# Patient Record
Sex: Female | Born: 1935 | Race: White | Hispanic: No
Health system: Southern US, Community
[De-identification: ages and names within clinical notes are randomized; demographics above are authoritative.]

## PROBLEM LIST (undated history)

## (undated) DIAGNOSIS — F419 Anxiety disorder, unspecified: Secondary | ICD-10-CM

## (undated) DIAGNOSIS — I2510 Atherosclerotic heart disease of native coronary artery without angina pectoris: Secondary | ICD-10-CM

## (undated) DIAGNOSIS — E119 Type 2 diabetes mellitus without complications: Secondary | ICD-10-CM

## (undated) DIAGNOSIS — D649 Anemia, unspecified: Secondary | ICD-10-CM

## (undated) DIAGNOSIS — K219 Gastro-esophageal reflux disease without esophagitis: Secondary | ICD-10-CM

## (undated) DIAGNOSIS — I639 Cerebral infarction, unspecified: Secondary | ICD-10-CM

## (undated) DIAGNOSIS — M1990 Unspecified osteoarthritis, unspecified site: Secondary | ICD-10-CM

## (undated) HISTORY — DX: Cerebral infarction, unspecified: I63.9

## (undated) HISTORY — DX: Type 2 diabetes mellitus without complications: E11.9

## (undated) HISTORY — DX: Atherosclerotic heart disease of native coronary artery without angina pectoris: I25.10

## (undated) HISTORY — DX: Unspecified osteoarthritis, unspecified site: M19.90

## (undated) HISTORY — DX: Anemia, unspecified: D64.9

## (undated) HISTORY — DX: Gastro-esophageal reflux disease without esophagitis: K21.9

## (undated) HISTORY — DX: Anxiety disorder, unspecified: F41.9

---
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
## 1966-10-14 HISTORY — PX: CHOLECYSTECTOMY: SHX55

## 1969-01-15 DIAGNOSIS — I10 Essential (primary) hypertension: Secondary | ICD-10-CM | POA: Insufficient documentation

## 1972-10-14 HISTORY — PX: PATELLA FRACTURE SURGERY: SHX735

## 1990-10-14 HISTORY — PX: ESOPHAGEAL DILATION: SHX303

## 1994-10-14 HISTORY — PX: CATARACT EXTRACTION: SUR2

## 2004-02-20 ENCOUNTER — Other Ambulatory Visit: Payer: Self-pay

## 2004-06-26 ENCOUNTER — Other Ambulatory Visit: Payer: Self-pay

## 2004-06-29 ENCOUNTER — Other Ambulatory Visit: Payer: Self-pay

## 2004-08-02 ENCOUNTER — Other Ambulatory Visit: Payer: Self-pay

## 2004-08-02 ENCOUNTER — Inpatient Hospital Stay: Payer: Self-pay | Admitting: Internal Medicine

## 2005-03-05 ENCOUNTER — Ambulatory Visit: Payer: Self-pay | Admitting: Family Medicine

## 2006-02-19 ENCOUNTER — Encounter (INDEPENDENT_AMBULATORY_CARE_PROVIDER_SITE_OTHER): Payer: Self-pay | Admitting: *Deleted

## 2006-02-19 LAB — CONVERTED CEMR LAB: Hgb A1c MFr Bld: 6.4 %

## 2006-03-11 ENCOUNTER — Ambulatory Visit: Payer: Self-pay | Admitting: Family Medicine

## 2006-03-18 ENCOUNTER — Ambulatory Visit: Payer: Self-pay | Admitting: Family Medicine

## 2006-04-02 ENCOUNTER — Ambulatory Visit: Payer: Self-pay | Admitting: Internal Medicine

## 2006-04-06 ENCOUNTER — Other Ambulatory Visit: Payer: Self-pay

## 2006-04-06 ENCOUNTER — Inpatient Hospital Stay: Payer: Self-pay | Admitting: Specialist

## 2006-04-11 ENCOUNTER — Encounter: Payer: Self-pay | Admitting: Internal Medicine

## 2006-04-13 ENCOUNTER — Encounter: Payer: Self-pay | Admitting: Internal Medicine

## 2006-05-14 HISTORY — PX: ESOPHAGOGASTRODUODENOSCOPY: SHX1529

## 2006-05-16 ENCOUNTER — Ambulatory Visit: Payer: Self-pay | Admitting: Gastroenterology

## 2006-05-21 ENCOUNTER — Encounter (INDEPENDENT_AMBULATORY_CARE_PROVIDER_SITE_OTHER): Payer: Self-pay | Admitting: *Deleted

## 2006-05-21 ENCOUNTER — Encounter: Payer: Self-pay | Admitting: Internal Medicine

## 2006-05-21 LAB — CONVERTED CEMR LAB
RBC count: 3.6 10*6/uL
WBC, blood: 8.2 10*3/uL

## 2006-05-22 ENCOUNTER — Ambulatory Visit: Payer: Self-pay | Admitting: Gastroenterology

## 2006-05-30 ENCOUNTER — Ambulatory Visit: Payer: Self-pay | Admitting: Gastroenterology

## 2006-06-10 ENCOUNTER — Ambulatory Visit: Payer: Self-pay | Admitting: Internal Medicine

## 2006-07-11 ENCOUNTER — Ambulatory Visit: Payer: Self-pay | Admitting: Family Medicine

## 2006-07-25 ENCOUNTER — Ambulatory Visit: Payer: Self-pay | Admitting: *Deleted

## 2006-08-13 ENCOUNTER — Encounter (INDEPENDENT_AMBULATORY_CARE_PROVIDER_SITE_OTHER): Payer: Self-pay | Admitting: *Deleted

## 2006-10-30 ENCOUNTER — Ambulatory Visit: Payer: Self-pay | Admitting: *Deleted

## 2006-11-26 ENCOUNTER — Encounter: Payer: Self-pay | Admitting: Internal Medicine

## 2007-01-16 ENCOUNTER — Ambulatory Visit: Payer: Self-pay | Admitting: *Deleted

## 2007-01-16 DIAGNOSIS — M722 Plantar fascial fibromatosis: Secondary | ICD-10-CM

## 2007-01-16 DIAGNOSIS — I7389 Other specified peripheral vascular diseases: Secondary | ICD-10-CM

## 2007-01-20 ENCOUNTER — Encounter: Payer: Self-pay | Admitting: *Deleted

## 2007-01-25 DIAGNOSIS — K219 Gastro-esophageal reflux disease without esophagitis: Secondary | ICD-10-CM | POA: Insufficient documentation

## 2007-01-25 DIAGNOSIS — E119 Type 2 diabetes mellitus without complications: Secondary | ICD-10-CM

## 2007-01-25 DIAGNOSIS — F411 Generalized anxiety disorder: Secondary | ICD-10-CM | POA: Insufficient documentation

## 2007-01-25 DIAGNOSIS — D649 Anemia, unspecified: Secondary | ICD-10-CM

## 2007-01-25 DIAGNOSIS — Z8679 Personal history of other diseases of the circulatory system: Secondary | ICD-10-CM | POA: Insufficient documentation

## 2007-01-28 ENCOUNTER — Encounter (INDEPENDENT_AMBULATORY_CARE_PROVIDER_SITE_OTHER): Payer: Self-pay | Admitting: *Deleted

## 2007-01-31 DIAGNOSIS — F329 Major depressive disorder, single episode, unspecified: Secondary | ICD-10-CM

## 2007-01-31 DIAGNOSIS — F3289 Other specified depressive episodes: Secondary | ICD-10-CM | POA: Insufficient documentation

## 2007-02-04 IMAGING — CR DG HIP COMPLETE 2+V*L*
1 series · 2 of 2 positions shown · non-contrast
Comparison: none

REASON FOR EXAM: fall
COMMENTS:  LMP: Post-Menopausal

[Series 1: view not recorded · 0.17mm/px · 2 of 2 slices shown]
[im 1/2]
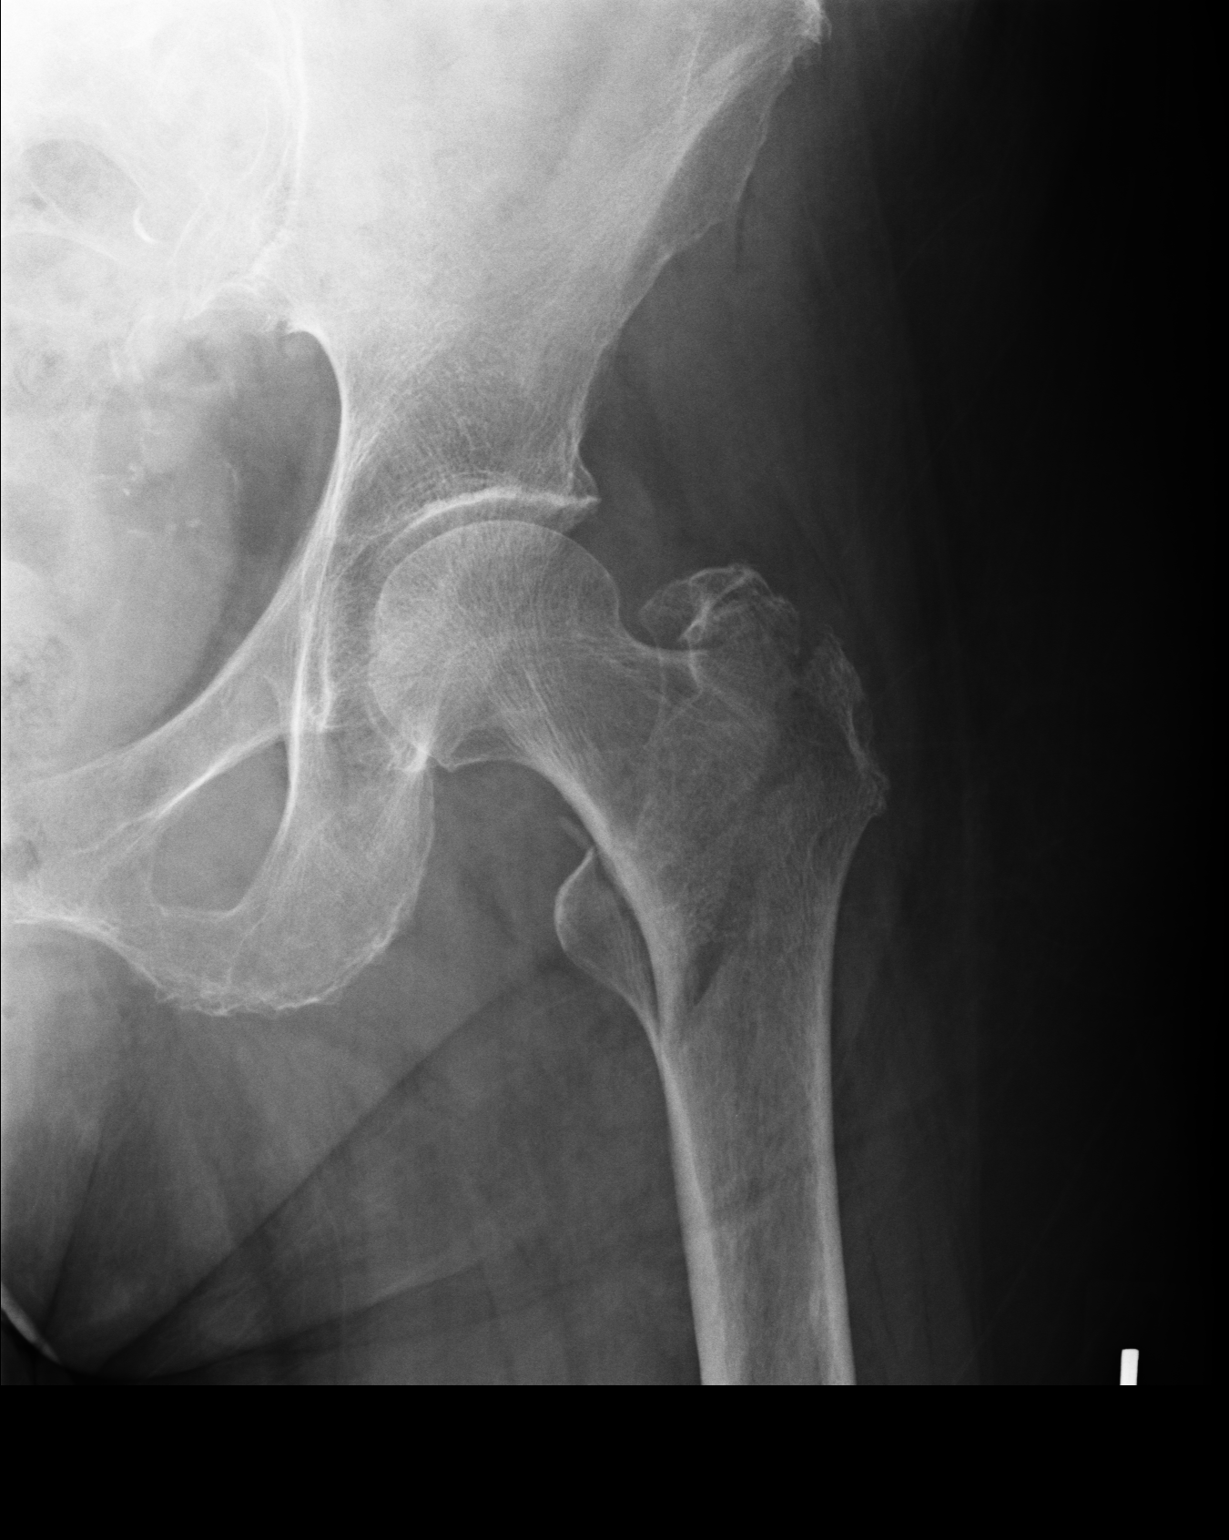
[im 2/2]
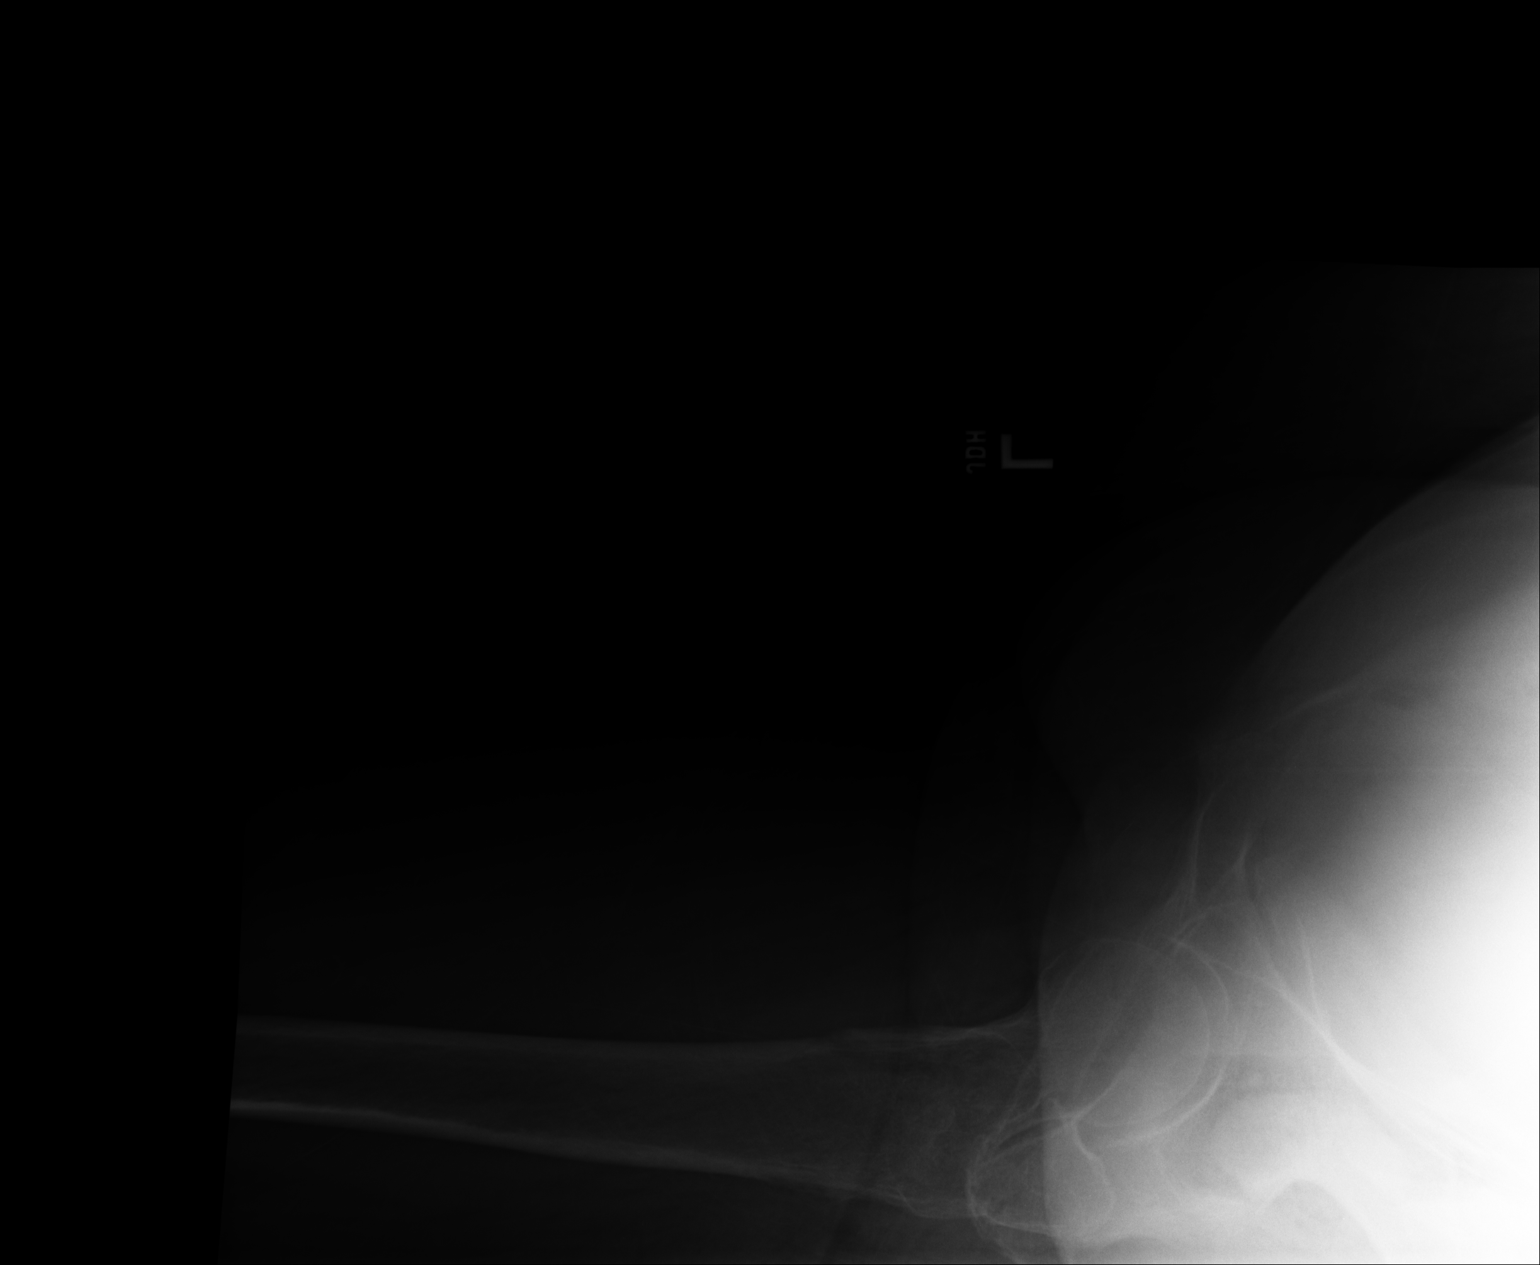

[2 of 2 positions shown; findings below may reference images not displayed]

PROCEDURE:     DXR - DXR HIP LEFT COMPLETE  - April 06, 2006  [DATE]

RESULT:          The patient has sustained an intertrochanteric fracture of
the LEFT  hip.  There is likely avulsion of the lesser trochanter and a
portion of the greater trochanter.  The femoral head and neck themselves are
grossly intact, and the bony acetabulum is intact.
IMPRESSION: The patient has sustained an intertrochanteric fracture
of the LEFT hip.

## 2007-02-05 ENCOUNTER — Ambulatory Visit: Payer: Self-pay | Admitting: *Deleted

## 2007-02-05 DIAGNOSIS — R350 Frequency of micturition: Secondary | ICD-10-CM | POA: Insufficient documentation

## 2007-02-06 ENCOUNTER — Encounter: Payer: Self-pay | Admitting: *Deleted

## 2007-03-19 ENCOUNTER — Ambulatory Visit: Payer: Self-pay | Admitting: *Deleted

## 2007-04-22 ENCOUNTER — Encounter (INDEPENDENT_AMBULATORY_CARE_PROVIDER_SITE_OTHER): Payer: Self-pay | Admitting: *Deleted

## 2007-05-08 ENCOUNTER — Ambulatory Visit: Payer: Self-pay | Admitting: *Deleted

## 2007-05-12 ENCOUNTER — Telehealth (INDEPENDENT_AMBULATORY_CARE_PROVIDER_SITE_OTHER): Payer: Self-pay | Admitting: *Deleted

## 2007-05-27 ENCOUNTER — Encounter (INDEPENDENT_AMBULATORY_CARE_PROVIDER_SITE_OTHER): Payer: Self-pay | Admitting: *Deleted

## 2007-05-29 ENCOUNTER — Encounter (INDEPENDENT_AMBULATORY_CARE_PROVIDER_SITE_OTHER): Payer: Self-pay | Admitting: *Deleted

## 2007-06-25 ENCOUNTER — Encounter (INDEPENDENT_AMBULATORY_CARE_PROVIDER_SITE_OTHER): Payer: Self-pay | Admitting: *Deleted

## 2007-07-13 ENCOUNTER — Telehealth (INDEPENDENT_AMBULATORY_CARE_PROVIDER_SITE_OTHER): Payer: Self-pay | Admitting: *Deleted

## 2007-07-13 ENCOUNTER — Ambulatory Visit: Payer: Self-pay | Admitting: *Deleted

## 2007-07-22 ENCOUNTER — Telehealth: Payer: Self-pay | Admitting: Family Medicine

## 2007-07-28 ENCOUNTER — Encounter: Payer: Self-pay | Admitting: Family Medicine

## 2007-08-04 ENCOUNTER — Encounter: Payer: Self-pay | Admitting: Internal Medicine

## 2007-08-24 ENCOUNTER — Encounter: Payer: Self-pay | Admitting: *Deleted

## 2007-08-24 ENCOUNTER — Ambulatory Visit: Payer: Self-pay | Admitting: Otolaryngology

## 2007-08-24 ENCOUNTER — Ambulatory Visit: Payer: Self-pay | Admitting: *Deleted

## 2007-08-24 ENCOUNTER — Other Ambulatory Visit: Payer: Self-pay

## 2007-08-24 ENCOUNTER — Encounter (INDEPENDENT_AMBULATORY_CARE_PROVIDER_SITE_OTHER): Payer: Self-pay | Admitting: *Deleted

## 2007-08-26 ENCOUNTER — Ambulatory Visit: Payer: Self-pay | Admitting: Otolaryngology

## 2007-09-21 ENCOUNTER — Ambulatory Visit: Payer: Self-pay | Admitting: Cardiology

## 2007-12-10 ENCOUNTER — Ambulatory Visit: Payer: Self-pay | Admitting: *Deleted

## 2007-12-10 ENCOUNTER — Encounter: Payer: Self-pay | Admitting: *Deleted

## 2007-12-10 ENCOUNTER — Encounter (INDEPENDENT_AMBULATORY_CARE_PROVIDER_SITE_OTHER): Payer: Self-pay | Admitting: *Deleted

## 2007-12-11 ENCOUNTER — Ambulatory Visit: Payer: Self-pay | Admitting: Family Medicine

## 2007-12-11 DIAGNOSIS — E785 Hyperlipidemia, unspecified: Secondary | ICD-10-CM

## 2007-12-15 ENCOUNTER — Telehealth (INDEPENDENT_AMBULATORY_CARE_PROVIDER_SITE_OTHER): Payer: Self-pay | Admitting: *Deleted

## 2007-12-15 LAB — CONVERTED CEMR LAB
ALT: 13 units/L (ref 0–35)
AST: 18 units/L (ref 0–37)
Albumin: 3.6 g/dL (ref 3.5–5.2)
Alkaline Phosphatase: 44 units/L (ref 39–117)
BUN: 18 mg/dL (ref 6–23)
Basophils Absolute: 0 10*3/uL (ref 0.0–0.1)
Basophils Relative: 0.6 % (ref 0.0–1.0)
Bilirubin, Direct: 0.1 mg/dL (ref 0.0–0.3)
CO2: 27 meq/L (ref 19–32)
CRP, High Sensitivity: 1 (ref 0.00–5.00)
Calcium: 9.5 mg/dL (ref 8.4–10.5)
Chloride: 107 meq/L (ref 96–112)
Cholesterol: 168 mg/dL (ref 0–200)
Creatinine, Ser: 1.4 mg/dL — ABNORMAL HIGH (ref 0.4–1.2)
Eosinophils Absolute: 0.3 10*3/uL (ref 0.0–0.6)
Eosinophils Relative: 3.4 % (ref 0.0–5.0)
Free T4: 0.8 ng/dL (ref 0.6–1.6)
GFR calc Af Amer: 46 mL/min
GFR calc non Af Amer: 38 mL/min
Glucose, Bld: 137 mg/dL — ABNORMAL HIGH (ref 70–99)
HCT: 35.2 % — ABNORMAL LOW (ref 36.0–46.0)
HDL: 65.8 mg/dL (ref >39.0)
Hemoglobin: 11.2 g/dL — ABNORMAL LOW (ref 12.0–15.0)
Hgb A1c MFr Bld: 6.6 % — ABNORMAL HIGH (ref 4.6–6.0)
LDL Cholesterol: 77 mg/dL (ref 0–99)
Lymphocytes Relative: 22.5 % (ref 12.0–46.0)
MCHC: 31.8 g/dL (ref 30.0–36.0)
MCV: 90.1 fL (ref 78.0–100.0)
Monocytes Absolute: 0.6 10*3/uL (ref 0.2–0.7)
Monocytes Relative: 7.9 % (ref 3.0–11.0)
Neutro Abs: 5.5 10*3/uL (ref 1.4–7.7)
Neutrophils Relative %: 65.6 % (ref 43.0–77.0)
Platelets: 206 10*3/uL (ref 150–400)
Potassium: 4.2 meq/L (ref 3.5–5.1)
RBC: 3.91 M/uL (ref 3.87–5.11)
RDW: 14.4 % (ref 11.5–14.6)
Sed Rate: 15 mm/hr (ref 0–25)
Sodium: 143 meq/L (ref 135–145)
TSH: 2.51 microintl units/mL (ref 0.35–5.50)
Total Bilirubin: 0.7 mg/dL (ref 0.3–1.2)
Total CHOL/HDL Ratio: 2.6
Total Protein: 6.5 g/dL (ref 6.0–8.3)
Triglycerides: 126 mg/dL (ref 0–149)
VLDL: 25 mg/dL (ref 0–40)
WBC: 8.2 10*3/uL (ref 4.5–10.5)

## 2007-12-24 ENCOUNTER — Telehealth (INDEPENDENT_AMBULATORY_CARE_PROVIDER_SITE_OTHER): Payer: Self-pay | Admitting: *Deleted

## 2008-01-11 ENCOUNTER — Encounter (INDEPENDENT_AMBULATORY_CARE_PROVIDER_SITE_OTHER): Payer: Self-pay | Admitting: *Deleted

## 2008-02-09 ENCOUNTER — Ambulatory Visit: Payer: Self-pay | Admitting: *Deleted

## 2008-02-18 ENCOUNTER — Encounter (INDEPENDENT_AMBULATORY_CARE_PROVIDER_SITE_OTHER): Payer: Self-pay | Admitting: *Deleted

## 2008-03-04 ENCOUNTER — Encounter (INDEPENDENT_AMBULATORY_CARE_PROVIDER_SITE_OTHER): Payer: Self-pay | Admitting: *Deleted

## 2008-03-04 ENCOUNTER — Ambulatory Visit: Payer: Self-pay | Admitting: *Deleted

## 2008-03-04 ENCOUNTER — Encounter: Payer: Self-pay | Admitting: *Deleted

## 2008-03-19 DIAGNOSIS — G4700 Insomnia, unspecified: Secondary | ICD-10-CM | POA: Insufficient documentation

## 2008-03-21 ENCOUNTER — Encounter (INDEPENDENT_AMBULATORY_CARE_PROVIDER_SITE_OTHER): Payer: Self-pay | Admitting: *Deleted

## 2008-04-05 ENCOUNTER — Encounter: Payer: Self-pay | Admitting: Internal Medicine

## 2008-04-14 ENCOUNTER — Encounter: Payer: Self-pay | Admitting: Internal Medicine

## 2008-06-01 ENCOUNTER — Encounter: Payer: Self-pay | Admitting: Internal Medicine

## 2008-06-17 ENCOUNTER — Telehealth: Payer: Self-pay | Admitting: Internal Medicine

## 2008-06-17 ENCOUNTER — Encounter: Payer: Self-pay | Admitting: Internal Medicine

## 2008-06-21 ENCOUNTER — Ambulatory Visit: Payer: Self-pay | Admitting: Internal Medicine

## 2008-06-22 ENCOUNTER — Encounter: Payer: Self-pay | Admitting: Internal Medicine

## 2008-06-22 LAB — CONVERTED CEMR LAB: RBC / HPF: NONE SEEN (ref <3)

## 2008-06-29 ENCOUNTER — Encounter: Payer: Self-pay | Admitting: Internal Medicine

## 2008-06-29 ENCOUNTER — Ambulatory Visit: Payer: Self-pay | Admitting: Internal Medicine

## 2008-06-29 DIAGNOSIS — I2510 Atherosclerotic heart disease of native coronary artery without angina pectoris: Secondary | ICD-10-CM | POA: Insufficient documentation

## 2008-06-30 ENCOUNTER — Encounter: Payer: Self-pay | Admitting: Internal Medicine

## 2008-07-28 ENCOUNTER — Telehealth: Payer: Self-pay | Admitting: Internal Medicine

## 2008-08-03 ENCOUNTER — Encounter: Payer: Self-pay | Admitting: Family Medicine

## 2008-08-08 ENCOUNTER — Ambulatory Visit: Payer: Medicare Other | Admitting: Gastroenterology

## 2008-08-14 HISTORY — PX: OTHER SURGICAL HISTORY: SHX169

## 2008-08-17 ENCOUNTER — Encounter: Payer: Self-pay | Admitting: Internal Medicine

## 2008-08-23 ENCOUNTER — Encounter: Payer: Self-pay | Admitting: Orthopedic Surgery

## 2008-08-23 ENCOUNTER — Telehealth: Payer: Self-pay | Admitting: Internal Medicine

## 2008-08-24 ENCOUNTER — Encounter: Payer: Self-pay | Admitting: Internal Medicine

## 2008-08-25 ENCOUNTER — Telehealth: Payer: Self-pay | Admitting: Internal Medicine

## 2008-08-25 ENCOUNTER — Encounter: Payer: Self-pay | Admitting: Internal Medicine

## 2008-08-25 LAB — CONVERTED CEMR LAB
Bilirubin Urine: NEGATIVE
Hemoglobin, Urine: NEGATIVE
Ketones, ur: NEGATIVE mg/dL
Nitrite: POSITIVE — AB
Protein, ur: NEGATIVE mg/dL
Specific Gravity, Urine: 1.027 (ref 1.005–1.03)
Urine Glucose: >1000 mg/dL — AB
Urobilinogen, UA: 0.2 (ref 0.0–1.0)
pH: 5.5 (ref 5.0–8.0)

## 2008-09-06 ENCOUNTER — Encounter: Payer: Self-pay | Admitting: Orthopedic Surgery

## 2008-09-19 ENCOUNTER — Encounter: Payer: Self-pay | Admitting: Internal Medicine

## 2008-09-20 ENCOUNTER — Encounter: Payer: Self-pay | Admitting: Internal Medicine

## 2008-09-20 ENCOUNTER — Telehealth: Payer: Self-pay | Admitting: Internal Medicine

## 2008-09-20 LAB — CONVERTED CEMR LAB
Bilirubin Urine: NEGATIVE
Blood in Urine, dipstick: NEGATIVE
Glucose, Urine, Semiquant: >=1000
Nitrite: NEGATIVE
Specific Gravity, Urine: 1.01
Urobilinogen, UA: 0.2
WBC Urine, dipstick: NEGATIVE
pH: 5

## 2008-09-21 ENCOUNTER — Encounter: Payer: Self-pay | Admitting: Internal Medicine

## 2008-09-26 ENCOUNTER — Telehealth: Payer: Self-pay | Admitting: Orthopedic Surgery

## 2008-09-26 ENCOUNTER — Encounter: Payer: Self-pay | Admitting: Orthopedic Surgery

## 2008-09-28 ENCOUNTER — Telehealth: Payer: Self-pay | Admitting: Internal Medicine

## 2008-10-24 ENCOUNTER — Telehealth: Payer: Self-pay | Admitting: Internal Medicine

## 2008-10-26 ENCOUNTER — Telehealth: Payer: Self-pay | Admitting: Family Medicine

## 2008-10-27 ENCOUNTER — Encounter: Payer: Self-pay | Admitting: Orthopedic Surgery

## 2008-10-28 ENCOUNTER — Telehealth: Payer: Self-pay | Admitting: Internal Medicine

## 2008-10-31 ENCOUNTER — Ambulatory Visit: Payer: Self-pay | Admitting: Internal Medicine

## 2008-11-01 ENCOUNTER — Encounter: Payer: Self-pay | Admitting: Orthopedic Surgery

## 2008-11-02 ENCOUNTER — Emergency Department: Payer: Medicare Other

## 2008-11-02 ENCOUNTER — Encounter: Payer: Self-pay | Admitting: Orthopedic Surgery

## 2008-11-03 ENCOUNTER — Encounter: Payer: Self-pay | Admitting: Internal Medicine

## 2008-11-07 ENCOUNTER — Encounter: Payer: Self-pay | Admitting: Orthopedic Surgery

## 2008-11-07 ENCOUNTER — Inpatient Hospital Stay: Payer: Medicare Other | Admitting: Internal Medicine

## 2008-11-07 ENCOUNTER — Telehealth: Payer: Self-pay | Admitting: Family Medicine

## 2008-11-11 ENCOUNTER — Encounter: Payer: Medicare Other | Admitting: Internal Medicine

## 2008-11-15 ENCOUNTER — Encounter: Payer: Self-pay | Admitting: Internal Medicine

## 2008-11-16 ENCOUNTER — Encounter: Payer: Medicare Other | Admitting: Internal Medicine

## 2008-12-12 ENCOUNTER — Encounter: Payer: Medicare Other | Admitting: Internal Medicine

## 2008-12-19 ENCOUNTER — Telehealth: Payer: Self-pay | Admitting: Internal Medicine

## 2008-12-22 ENCOUNTER — Encounter: Payer: Self-pay | Admitting: Orthopedic Surgery

## 2009-01-04 ENCOUNTER — Telehealth: Payer: Self-pay | Admitting: Internal Medicine

## 2009-01-18 ENCOUNTER — Telehealth: Payer: Self-pay | Admitting: Internal Medicine

## 2009-01-27 ENCOUNTER — Telehealth: Payer: Self-pay | Admitting: Internal Medicine

## 2009-03-06 ENCOUNTER — Encounter: Payer: Self-pay | Admitting: Internal Medicine

## 2009-03-09 ENCOUNTER — Ambulatory Visit: Payer: Self-pay | Admitting: Internal Medicine

## 2009-03-09 ENCOUNTER — Encounter: Payer: Self-pay | Admitting: Internal Medicine

## 2009-04-10 ENCOUNTER — Encounter: Payer: Self-pay | Admitting: Family Medicine

## 2009-04-20 ENCOUNTER — Encounter: Payer: Self-pay | Admitting: Internal Medicine

## 2009-04-24 ENCOUNTER — Encounter: Payer: Self-pay | Admitting: Internal Medicine

## 2009-04-25 ENCOUNTER — Telehealth: Payer: Self-pay | Admitting: Internal Medicine

## 2009-05-01 ENCOUNTER — Telehealth: Payer: Self-pay | Admitting: Internal Medicine

## 2009-05-05 ENCOUNTER — Encounter: Payer: Self-pay | Admitting: Internal Medicine

## 2009-05-15 ENCOUNTER — Encounter: Payer: Self-pay | Admitting: Internal Medicine

## 2009-05-26 ENCOUNTER — Telehealth: Payer: Self-pay | Admitting: Internal Medicine

## 2009-05-26 ENCOUNTER — Encounter: Payer: Self-pay | Admitting: Internal Medicine

## 2009-05-29 ENCOUNTER — Encounter: Payer: Self-pay | Admitting: Internal Medicine

## 2009-05-29 ENCOUNTER — Telehealth: Payer: Self-pay | Admitting: Internal Medicine

## 2009-06-07 ENCOUNTER — Telehealth: Payer: Self-pay | Admitting: Internal Medicine

## 2009-06-08 ENCOUNTER — Encounter: Payer: Self-pay | Admitting: Internal Medicine

## 2009-06-22 ENCOUNTER — Encounter: Payer: Self-pay | Admitting: Internal Medicine

## 2009-06-26 ENCOUNTER — Encounter: Payer: Self-pay | Admitting: Internal Medicine

## 2009-06-30 ENCOUNTER — Encounter: Payer: Self-pay | Admitting: Internal Medicine

## 2009-06-30 ENCOUNTER — Telehealth: Payer: Self-pay | Admitting: Internal Medicine

## 2009-07-03 ENCOUNTER — Telehealth: Payer: Self-pay | Admitting: Internal Medicine

## 2009-07-04 ENCOUNTER — Telehealth: Payer: Self-pay | Admitting: Internal Medicine

## 2009-07-06 ENCOUNTER — Encounter: Payer: Self-pay | Admitting: Internal Medicine

## 2009-07-06 DIAGNOSIS — M1990 Unspecified osteoarthritis, unspecified site: Secondary | ICD-10-CM | POA: Insufficient documentation

## 2009-07-06 DIAGNOSIS — J309 Allergic rhinitis, unspecified: Secondary | ICD-10-CM | POA: Insufficient documentation

## 2009-07-06 DIAGNOSIS — R42 Dizziness and giddiness: Secondary | ICD-10-CM

## 2009-07-07 ENCOUNTER — Telehealth: Payer: Self-pay | Admitting: Internal Medicine

## 2009-07-07 ENCOUNTER — Encounter: Payer: Self-pay | Admitting: Internal Medicine

## 2009-07-12 ENCOUNTER — Encounter: Payer: Self-pay | Admitting: Internal Medicine

## 2009-07-12 ENCOUNTER — Telehealth: Payer: Self-pay | Admitting: Internal Medicine

## 2009-07-14 ENCOUNTER — Ambulatory Visit: Payer: Self-pay | Admitting: Internal Medicine

## 2009-07-17 ENCOUNTER — Encounter: Payer: Self-pay | Admitting: Internal Medicine

## 2009-08-23 ENCOUNTER — Telehealth: Payer: Self-pay | Admitting: Internal Medicine

## 2009-08-25 ENCOUNTER — Ambulatory Visit: Payer: Self-pay | Admitting: Internal Medicine

## 2009-08-28 LAB — CONVERTED CEMR LAB
ALT: 11 units/L (ref 0–35)
AST: 16 units/L (ref 0–37)
Albumin: 3.3 g/dL — ABNORMAL LOW (ref 3.5–5.2)
Alkaline Phosphatase: 69 units/L (ref 39–117)
BUN: 10 mg/dL (ref 6–23)
Basophils Absolute: 0 10*3/uL (ref 0.0–0.1)
Basophils Relative: 0.3 % (ref 0.0–3.0)
Bilirubin, Direct: 0 mg/dL (ref 0.0–0.3)
CO2: 26 meq/L (ref 19–32)
Calcium: 8.9 mg/dL (ref 8.4–10.5)
Chloride: 105 meq/L (ref 96–112)
Cholesterol: 215 mg/dL — ABNORMAL HIGH (ref 0–200)
Creatinine, Ser: 1.1 mg/dL (ref 0.4–1.2)
Direct LDL: 150.6 mg/dL
Eosinophils Absolute: 0.1 10*3/uL (ref 0.0–0.7)
Eosinophils Relative: 1.4 % (ref 0.0–5.0)
GFR calc non Af Amer: 49.84 mL/min (ref >60)
Glucose, Bld: 174 mg/dL — ABNORMAL HIGH (ref 70–99)
HCT: 29.8 % — ABNORMAL LOW (ref 36.0–46.0)
HDL: 41.9 mg/dL (ref >39.00)
Hemoglobin: 10 g/dL — ABNORMAL LOW (ref 12.0–15.0)
Hgb A1c MFr Bld: 7.5 % — ABNORMAL HIGH (ref 4.6–6.5)
Lymphocytes Relative: 21.5 % (ref 12.0–46.0)
Lymphs Abs: 2 10*3/uL (ref 0.7–4.0)
MCHC: 33.6 g/dL (ref 30.0–36.0)
MCV: 76.9 fL — ABNORMAL LOW (ref 78.0–100.0)
Monocytes Absolute: 0.7 10*3/uL (ref 0.1–1.0)
Monocytes Relative: 7.2 % (ref 3.0–12.0)
Neutro Abs: 6.6 10*3/uL (ref 1.4–7.7)
Neutrophils Relative %: 69.6 % (ref 43.0–77.0)
Phosphorus: 3.7 mg/dL (ref 2.3–4.6)
Platelets: 271 10*3/uL (ref 150.0–400.0)
Potassium: 4.6 meq/L (ref 3.5–5.1)
RBC: 3.87 M/uL (ref 3.87–5.11)
RDW: 16.7 % — ABNORMAL HIGH (ref 11.5–14.6)
Sodium: 140 meq/L (ref 135–145)
TSH: 1.39 microintl units/mL (ref 0.35–5.50)
Total Bilirubin: 0.6 mg/dL (ref 0.3–1.2)
Total CHOL/HDL Ratio: 5
Total Protein: 6.7 g/dL (ref 6.0–8.3)
Triglycerides: 184 mg/dL — ABNORMAL HIGH (ref 0.0–149.0)
VLDL: 36.8 mg/dL (ref 0.0–40.0)
WBC: 9.4 10*3/uL (ref 4.5–10.5)

## 2009-09-09 IMAGING — CR DG CHEST 2V
1 series · 2 of 2 positions shown · non-contrast
Comparison: none

REASON FOR EXAM: pleural effusions
COMMENTS:

[Series 1: view not recorded · 0.17mm/px · 2 of 2 slices shown]
[im 1/2]
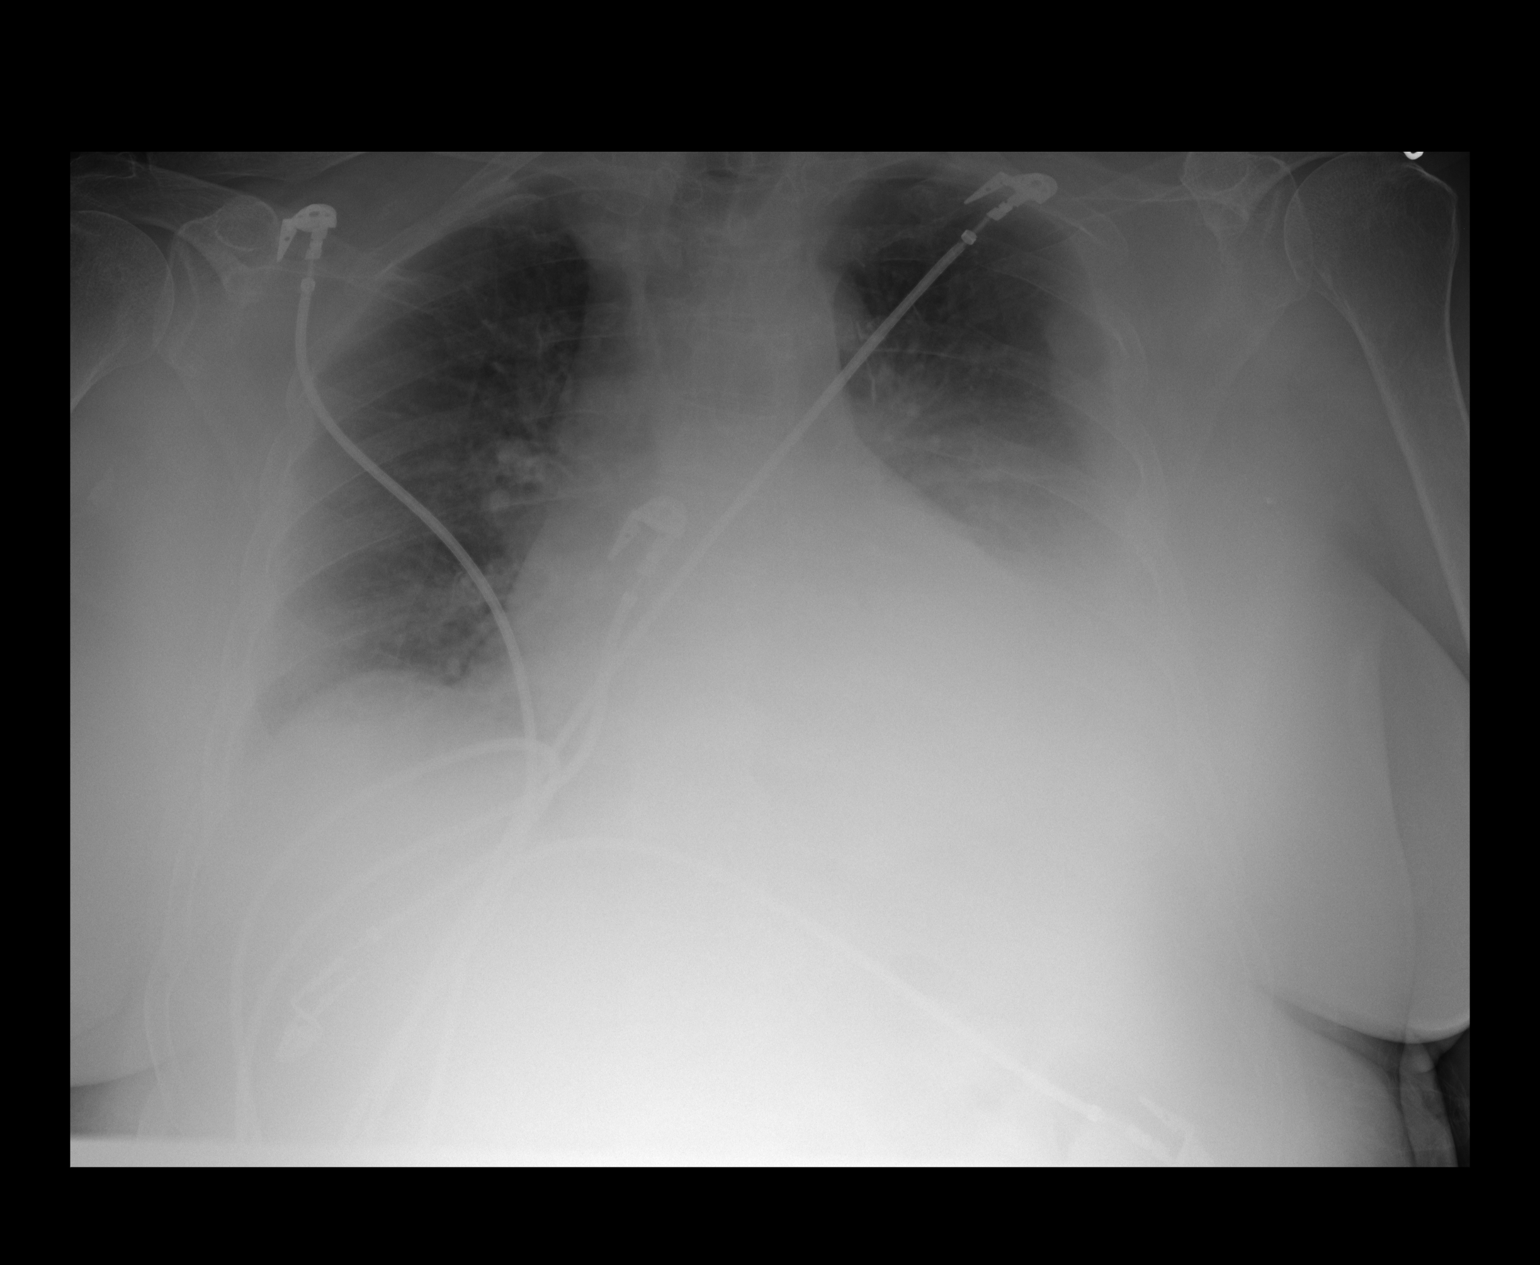
[im 2/2]
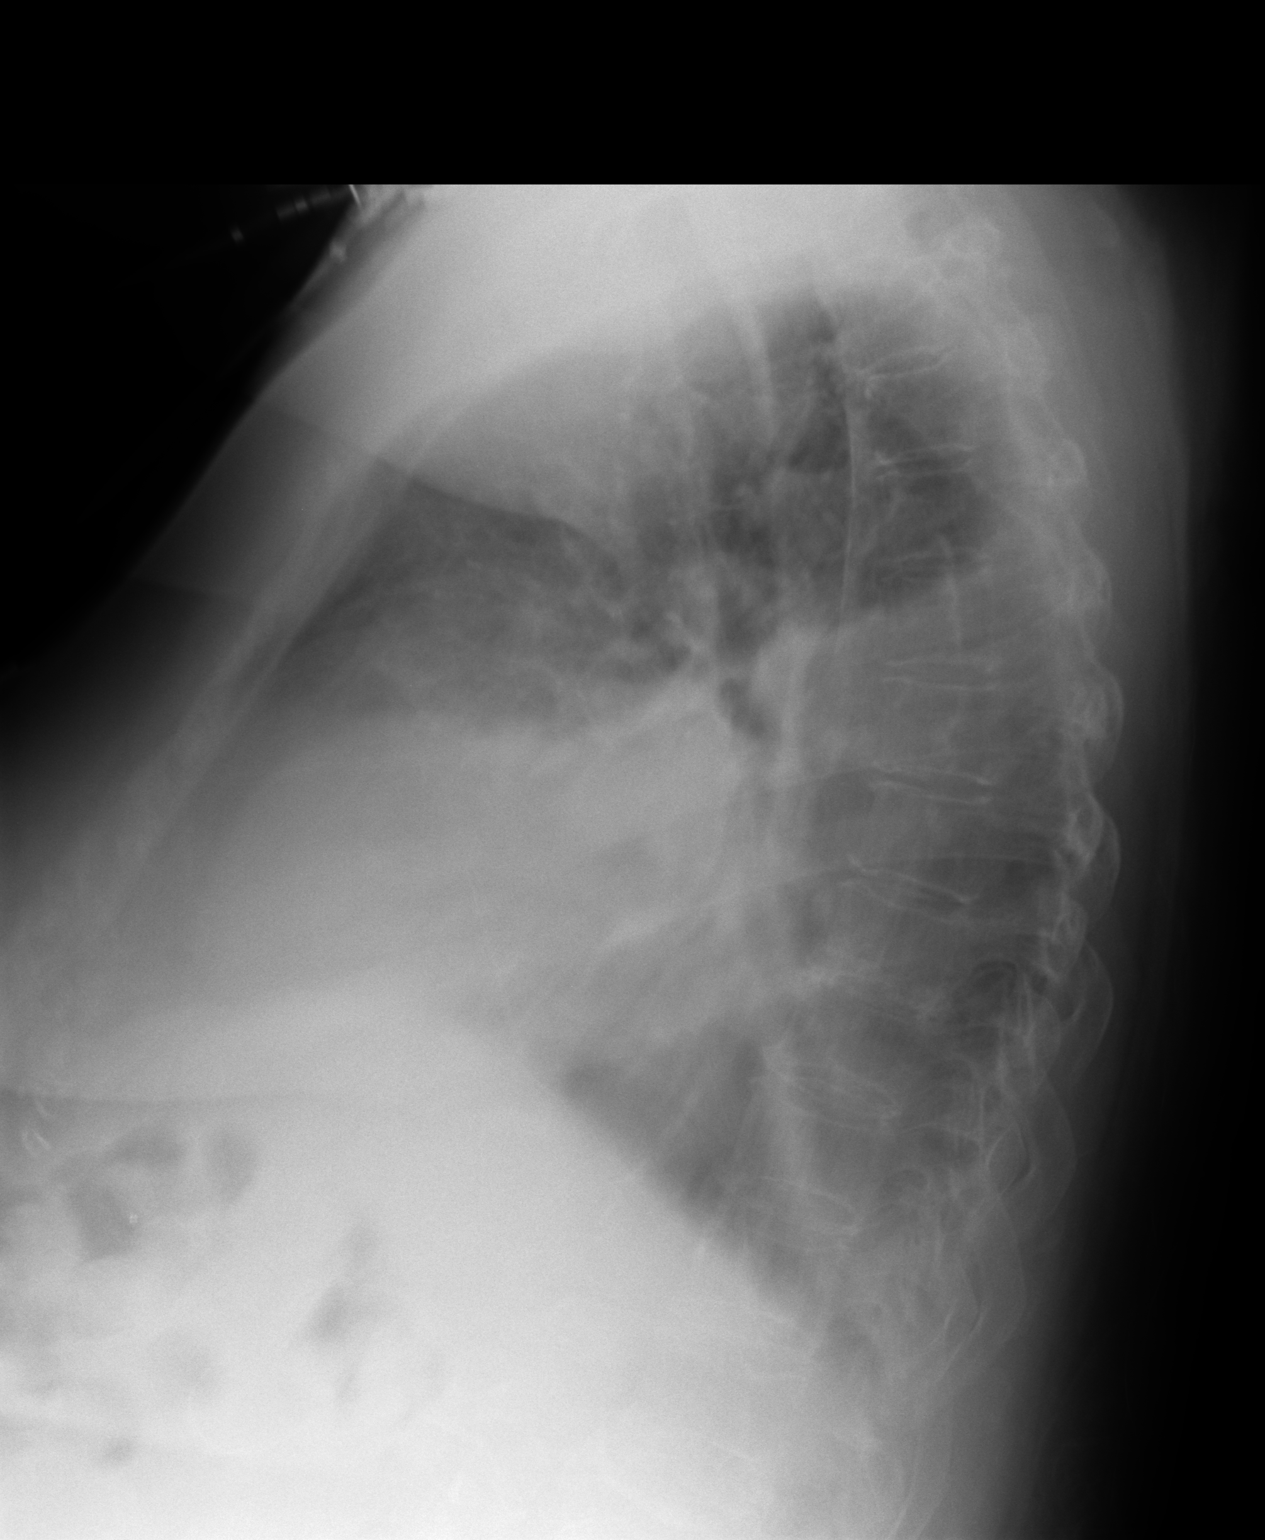

[2 of 2 positions shown; findings below may reference images not displayed]

PROCEDURE:     DXR - DXR CHEST PA (OR AP) AND LATERAL  - November 09, 2008  [DATE]

RESULT:     Comparison is made to a study 02 November, 2008.

The lungs are adequately inflated. The cardiac silhouette remains enlarged.
The left hemidiaphragm is more obscured today. The pulmonary vascularity is
ill-defined. The left lower lobe region is dense.
IMPRESSION: There are findings consistent with a left pleural effusion.
Left lower lobe atelectasis may be present. I do not see evidence of a right
pleural effusion. There is likely an element of CHF present. Overall, there
has been deterioration in the appearance of the chest since 02 November, 2008.

## 2009-09-29 ENCOUNTER — Telehealth: Payer: Self-pay | Admitting: Internal Medicine

## 2009-10-04 ENCOUNTER — Encounter: Payer: Self-pay | Admitting: Internal Medicine

## 2009-11-30 ENCOUNTER — Encounter: Payer: Self-pay | Admitting: Internal Medicine

## 2009-11-30 ENCOUNTER — Ambulatory Visit: Payer: Self-pay | Admitting: Internal Medicine

## 2010-01-29 ENCOUNTER — Encounter: Payer: Self-pay | Admitting: Internal Medicine

## 2010-02-08 ENCOUNTER — Ambulatory Visit: Payer: Medicare Other | Admitting: Gastroenterology

## 2010-02-08 ENCOUNTER — Encounter: Payer: Self-pay | Admitting: Internal Medicine

## 2010-04-18 ENCOUNTER — Encounter: Payer: Self-pay | Admitting: Family Medicine

## 2010-10-14 ENCOUNTER — Inpatient Hospital Stay (HOSPITAL_COMMUNITY)
Admission: EM | Admit: 2010-10-14 | Discharge: 2010-10-16 | Payer: Self-pay | Source: Home / Self Care | Attending: Internal Medicine | Admitting: Internal Medicine

## 2010-10-14 ENCOUNTER — Encounter: Payer: Self-pay | Admitting: Cardiovascular Disease

## 2010-10-14 ENCOUNTER — Encounter (INDEPENDENT_AMBULATORY_CARE_PROVIDER_SITE_OTHER): Payer: Self-pay | Admitting: Internal Medicine

## 2010-10-16 ENCOUNTER — Other Ambulatory Visit: Payer: Self-pay | Admitting: Internal Medicine

## 2010-10-31 ENCOUNTER — Emergency Department (HOSPITAL_COMMUNITY)
Admission: EM | Admit: 2010-10-31 | Discharge: 2010-10-31 | Payer: Self-pay | Source: Home / Self Care | Admitting: Emergency Medicine

## 2010-11-15 NOTE — Letter (Signed)
Summary: [facility]  [facility]   Imported By: Lanelle Bal 02/13/2010 13:20:03  _____________________________________________________________________  External Attachment:    Type:   Image     Comment:   External Document  Appended Document: [facility] planning repeat EGD for dysphagia

## 2010-11-15 NOTE — Assessment & Plan Note (Signed)
Summary: [facility]   Vital Signs:  Patient profile:   77 year old female Weight:      182 pounds Pulse rate:   80 / minute Resp:     16 per minute BP sitting:   142 / 72 CC: Assisted living follow up   History of Present Illness: Reviewed status with [facility]--clinical director No new concerns  REsident is very satisfied Does note mild stable DOE walking with rolling walker No chest pain  good sugar control generally in low 100's---not above 200 No hypoglycemic spells  No pain problems dizziness has been gone Interested in trying off the meds  Mood has been good   Allergies: No Known Drug Allergies  Review of Systems       Appetite is excellant weight down a few pounds No edema sleeps very well Heartburn controlled  Physical Exam  General:  alert and normal appearance.   Neck:  supple, no masses, no thyromegaly, no carotid bruits, and no cervical lymphadenopathy.   Lungs:  normal respiratory effort and normal breath sounds.   Heart:  normal rate, regular rhythm, no murmur, and no gallop.   Abdomen:  soft and non-tender.   Msk:  no joint tenderness and no joint swelling.   Pulses:  faint in feet Extremities:  no sig edema Skin:  no suspicious lesions and no ulcerations.   Psych:  normally interactive, good eye contact, not anxious appearing, and not depressed appearing.     Impression & Recommendations:  Problem # 1:  CORONARY ARTERY DISEASE (ICD-414.00) Assessment Unchanged quiet no changes needed  Her updated medication list for this problem includes:    Aggrenox 25-200 Mg Cp12 (Aspirin-dipyridamole) .Marland Kitchen... Take one by mouth two times a day    Metoprolol Tartrate 100 Mg Tabs (Metoprolol tartrate) .Marland Kitchen... 1 tab two times a day    Lisinopril 40 Mg Tabs (Lisinopril) .Marland Kitchen... 1 tab daily    Amlodipine Besylate 5 Mg Tabs (Amlodipine besylate) .Marland Kitchen... 1 tab daily  Problem # 2:  DIABETES MELLITUS, TYPE II (ICD-250.00) Assessment: Unchanged good  control no hypoglycemia  Her updated medication list for this problem includes:    Glipizide Xl 10 Mg Xr24h-tab (Glipizide) .Marland Kitchen... 1 daily in the morning    Lisinopril 40 Mg Tabs (Lisinopril) .Marland Kitchen... 1 tab daily    Lantus 100 Unit/ml Soln (Insulin glargine) .Marland KitchenMarland KitchenMarland KitchenMarland Kitchen 10 units subcutaneously nightly  Labs Reviewed: Creat: 1.1 (08/25/2009)    Reviewed HgBA1c results: 7.5 (08/25/2009)  6.6 (12/11/2007)  Problem # 3:  OSTEOARTHRITIS (ICD-715.90) Assessment: Unchanged good control with tramadol  Her updated medication list for this problem includes:    Ultram 50 Mg Tabs (Tramadol hcl) .Marland Kitchen... Take 1 tablet by mouth three times a day  Problem # 4:  ANXIETY (ICD-300.00) Assessment: Unchanged anxiety and depression under good control  Her updated medication list for this problem includes:    Citalopram Hydrobromide 10 Mg Tabs (Citalopram hydrobromide) .Marland Kitchen... 1 daily    Trazodone Hcl 50 Mg Tabs (Trazodone hcl) .Marland Kitchen... 1 at bedtime  Problem # 5:  VERTIGO (ICD-780.4) Assessment: Improved will change meclizine to as needed   Her updated medication list for this problem includes:    Loratadine 10 Mg Tabs (Loratadine) .Marland Kitchen... 1 daily as directed    Meclizine Hcl 12.5 Mg Tabs (Meclizine hcl) .Marland Kitchen... 1 tab by mouth three times daily  as needed for vertigo  Problem # 6:  CEREBROVASCULAR ACCIDENT, HX OF (ICD-V12.50) Assessment: Unchanged stable on aggrenox  Problem # 7:  GERD (  ICD-530.81) Assessment: Unchanged doing well on med  Her updated medication list for this problem includes:    Prilosec 20 Mg Cpdr (Omeprazole) .Marland Kitchen... 1 before breakfast  Complete Medication List: 1)  Aggrenox 25-200 Mg Cp12 (Aspirin-dipyridamole) .... Take one by mouth two times a day 2)  Ultram 50 Mg Tabs (Tramadol hcl) .... Take 1 tablet by mouth three times a day 3)  Flonase 50 Mcg/act Susp (Fluticasone propionate) .... Two sprays in each nostril once a day. 4)  Citalopram Hydrobromide 10 Mg Tabs (Citalopram hydrobromide)  .Marland Kitchen.. 1 daily 5)  Prilosec 20 Mg Cpdr (Omeprazole) .Marland Kitchen.. 1 before breakfast 6)  Glipizide Xl 10 Mg Xr24h-tab (Glipizide) .Marland Kitchen.. 1 daily in the morning 7)  Metoprolol Tartrate 100 Mg Tabs (Metoprolol tartrate) .Marland Kitchen.. 1 tab two times a day 8)  Lisinopril 40 Mg Tabs (Lisinopril) .Marland Kitchen.. 1 tab daily 9)  Amlodipine Besylate 5 Mg Tabs (Amlodipine besylate) .Marland Kitchen.. 1 tab daily 10)  Trazodone Hcl 50 Mg Tabs (Trazodone hcl) .Marland Kitchen.. 1 at bedtime 11)  Loratadine 10 Mg Tabs (Loratadine) .Marland Kitchen.. 1 daily as directed 12)  Meclizine Hcl 12.5 Mg Tabs (Meclizine hcl) .Marland Kitchen.. 1 tab by mouth three times daily  as needed for vertigo 13)  Lantus 100 Unit/ml Soln (Insulin glargine) .Marland Kitchen.. 10 units subcutaneously nightly 14)  Vitamin D (ergocalciferol) 50000 Unit Caps (Ergocalciferol) .Marland Kitchen.. 1 tab monthly  Patient Instructions: 1)  Will plan follow up in about 4 months

## 2010-11-15 NOTE — H&P (Signed)
NAMERUBEN, PYKA             ACCOUNT NO.:  [id number]  MEDICAL RECORD NO.:  [id number]          PATIENT TYPE:  EMS  LOCATION:  MAJO                         FACILITY:  [facility]  PHYSICIAN:  Ladell Pier, M.D.   DATE OF BIRTH:  10-16-1919  DATE OF ADMISSION:  10/14/2010 DATE OF DISCHARGE:                             HISTORY & PHYSICAL   CHIEF COMPLAINT:  Slurred speech, left facial droop, and left upper extremity increased weakness.  HISTORY OF PRESENT ILLNESS:  The patient is an 77 year old white female with past medical history significant for multiple medical problems including previous stroke x2.  She also has hypertension and diabetes. The patient lives at an assisted living.  She had breakfast this morning and then after she completed breakfast, she was noted to have slurred speech with left facial droop and increased left upper extremity weakness.  She did not have any chest pain, no shortness of breath, no nausea or vomiting.  Symptoms started at about 8:30 a.m.  The patient was brought to the emergency room.  She was evaluated for code stroke by the code stroke team.  She was examined at 9:38.  She was deemed not a candidate for TP as her symptoms had resolved.  She had a low NIHSS and she had advanced age.  We were asked to admit the patient.  PAST MEDICAL HISTORY: 1. Significant for left upper extremity weakness secondary to     cerebrovascular accident that she had in Feb 11, 2006. 2. Hypertension. 3. Diabetes. 4. Gastroesophageal reflux disease. 5. Hiatal hernia. 6. History of esophageal stricture, status post dilatation. 7. Anxiety. 8. Hyperlipidemia. 9. Hyperhomocysteinemia. 10.Status post appendectomy. 11.History of hip fracture in 2009. 12.History of knee surgery.  FAMILY HISTORY:  Both parents are deceased.  Mother died at 90 from ovarian cancer.  Father died at 77 from a heart attack.  SOCIAL HISTORY:  She does not smoke or drink alcohol.  She is  widowed for the past 2 years, was married for over 60 years.  She has 5 children.  MEDICATIONS: 1. From the list from the nursing home, omeprazole 20 mg daily,     Norvasc 5 mg daily, Celexa/citalopram 10 mg daily, Flonase nasal     spray 2 sprays per nostril daily. 2. Lisinopril 40 mg daily. 3. Aggrenox 25/200 one by mouth twice daily. 4. Metoprolol 100 mg every 12 hours. 5. Meclizine 12.5 mg 1 tablet 3 times a day. 6. Tramadol 50 mg 3 times a day. 7. Glipizide ER 10 mg 1 tablet daily. 8. Metformin 500 twice daily. 9. Trazodone 50 mg half a tab q.h.s. p.r.n.  ALLERGIES:  None.  REVIEW OF SYSTEMS:  Negative, otherwise stated in the HPI.  PHYSICAL EXAM:  Lantus 16 units every evening.  PHYSICAL EXAM:  VITAL SIGNS:  Temperature 98.4, pulse of 66, respirations 20, blood pressure 149/46, pulse ox 100% on room air. GENERAL:  Patient is sitting up in the stretcher.  Well nourished white female. HEENT:  Normocephalic and atraumatic.  Pupils are reactive to light. Throat is without erythema. CARDIOVASCULAR:  Regular rate and rhythm. LUNGS:  Clear bilaterally.  No wheezes, rhonchi,  or rales. ABDOMEN:  Soft.  She had a scar as she had appendectomy.  Nontender, nondistended, positive bowel sounds. EXTREMITIES:  Trace edema bilaterally. NEUROLOGIC:  She does have some mild weakness on the left upper extremity but otherwise normal.  Cranial nerves II through XII are intact.  Strength 5/5 in the right upper extremity and bilateral lower extremity.  LABORATORY DATA:  Cardiac markers, myoglobin 65.3.  MB less than 1.0. Alcohol less than 5.  Hemoglobin 10.5, hematocrit 31.  Sodium 138, potassium 4.2, chloride 105, prior glucose of 136, BUN 14, creatinine 1.2.  Chest x-ray, borderline cardiomegaly, peribronchial thickening, probably hiatal hernia.  CT scan of the head, no acute intracranial hemorrhage, remote right frontal and right perineal infarct with encephalomalacia, significant small  vessel type changes.  EKG, heart rate 98, a few PVCs.  ASSESSMENT AND PLAN: 1. Question of cerebrovascular accident/transient ischemic attack. 2. Diabetes. 3. Hypertension. 4. Initial bradycardia that is now resolved. 5. Hypertension.  We will admit patient to the hospital.  Neurology     has already evaluated the patient.  We will keep her on the     Aggrenox and had a baby aspirin.  Neurology can changes to this     effect.  I will order the regular stroke workup , MRI, MRA, carotid     Dopplers, 2-D echo, cardiac markers.  We will also check TSH     secondary to the bradycardia.  We will resume most of her     medications and hold the beta blocker and Norvasc secondary to     borderline blood pressure.  We will continue Lantus at a lower dose     and hold her metformin and     Glucotrol, put her on sliding scale.  The patient is DNR as per     discussion with her daughter who also discussed with her sister.  Time spent with patient and doing this admission is approximately 55 minutes.     Ladell Pier, M.D.     [place]/MEDQ  D:  10/14/2010  T:  10/14/2010  Job:  [id number]  Electronically Signed by Ladell Pier M.D. on 11/15/2010 01:31:44 PM

## 2010-11-15 NOTE — Procedures (Signed)
Summary: Upper GI Endoscopy by Dr.Paul Oh,[facility]  Upper GI Endoscopy by Dr.Paul Oh,[facility]   Imported By: Beau Fanny 02/14/2010 16:09:42  _____________________________________________________________________  External Attachment:    Type:   Image     Comment:   External Document  Appended Document: Upper GI Endoscopy by Dr.Paul Oh,[facility] benign esophageal stricture dilated otherwise normal

## 2010-11-15 NOTE — Medication Information (Signed)
Summary: Trazodone Order/[facility]  Trazodone Order/[facility]   Imported By: Lanelle Bal 04/20/2010 13:01:43  _____________________________________________________________________  External Attachment:    Type:   Image     Comment:   External Document

## 2010-11-15 NOTE — Discharge Summary (Signed)
[name], [name]             ACCOUNT NO.:  [id number]  MEDICAL RECORD NO.:  [id number]          PATIENT TYPE:  INP  LOCATION:  3029                         FACILITY:  [facility]  PHYSICIAN:  [name], M.D.   DATE OF BIRTH:  02/22/1920  DATE OF ADMISSION:  10/14/2010 DATE OF DISCHARGE:  10/16/2010                              DISCHARGE SUMMARY   DISCHARGE DIAGNOSES: 1. Transient ischemic attack. 2. Tachybrady brady.  The patient to follow up with [facility]     in 10 days for continued outpatient workup. 3. Hypertension. 4. History of chronic left upper extremity weakness secondary to     cerebrovascular accident in May 2007. 5. Diabetes. 6. Gastroesophageal reflux disease. 7. Hiatal hernia. 8. History of esophageal stricture, status post dilatation. 9. Anxiety. 10.Hyperlipidemia. 11.Homocysteinemia. 12.Status post appendectomy. 13.Status post hip fracture. 14.History of knee surgery.  DISCHARGE MEDICATIONS: 1. Crestor 10 mg at bedtime. 2. Lantus 10 units at bedtime. 3. Norvasc 10 mg daily. 4. Aggrenox 25-100 twice daily. 5. Citalopram 10 mg daily. 6. Fluticasone nasal spray daily. 7. Lisinopril 40 mg daily. 8. Meclizine 12.5 mg 3 tablets daily. 9. Metformin 500 mg twice daily. 10.Omeprazole 20 mg every morning. 11.Vitamin D2 50,000 international units monthly.  FOLLOWUP APPOINTMENTS:  The patient to follow up with PCP, Dr. Real Pierce in 1-2 weeks.  The patient to follow up with [facility] in [place].  Call for appointment to follow up with about 10 days.  The patient to make an appointment to follow up with Dr. Pearlean Pierce to review the carotid Dopplers a 2-D echo report.  PROCEDURES:  MRI and MRA of the brain, significant intracranial atherosclerotic type changes as detailed above.  No acute infarct. Remote right frontal and right posterior temporal occipital infarct with encephalomalacia.  Chest x-ray borderline cardiomegaly,  peribronchial thickening, and probably hiatal hernia.  CONSULTANTS:  [facility], Dr. Pearlean Pierce.  HISTORY OF PRESENT ILLNESS:  The patient is an 77 year old female with past medical history significant for multiple medical problems including fever and previous stroke x2.  She also has hypertension and diabetes. The patient lives in an assisted living.  She had breakfast this morning and then after she completed breakfast, she was noted to have slurred speech and left facial droop with left upper extremity weakness.  She did not have any chest pain.  No shortness of breath.  No nausea or vomiting.  Symptoms started about 8:30 a.m.  The patient was brought to the emergency room.  She was evaluated for the code stroke by the Code Stroke Team.  She was examined at 9:38.  She was deemed not a candidate for tPA as her symptoms had resolved.  She had a low NIHSS score as advanced age.  Please see admission note for remainder of history, past medical history, family history, social history, meds, allergies, and review of systems per admission H and P.  PHYSICAL EXAMINATION:  VITAL SIGNS:  At the time of discharge, temperature 97.3, pulse of 64, respirations 20, blood pressure 167/51, pulse ox 97% on room air. GENERAL:  The patient is sitting up in bed, well-nourished female. HEENT:  Normocephalic, atraumatic.  Pupils are reactive to light.  Throat without erythema. CARDIOVASCULAR:  Regular rate and rhythm. LUNGS:  Clear bilaterally. ABDOMEN:  Positive bowel sounds. EXTREMITIES:  Without edema. NEUROLOGIC:  She no focal neurologic deficits.  HOSPITAL COURSE: 1. TIA:  The patient came in with slurred speech, facial droop, and     increased left upper extremity weakness.  This has now resolved.     She feels like she is back to baseline.  She had MRI/MRA done that     was negative for acute stroke.  Neurology was consulted.     Recommended discontinuing  with her Aggrenox.  She will follow up as     outpatient and will get a carotid Dopplers and 2-D echo done prior     to being discharged and the results can be followed up as     outpatient per neurology/PCP. 2. Tachybrady:  The patient had episodes where she was bradycardic     down into the 30s and tachycardic up into 120s and 130s.  She was     evaluated by Cardiology.  Since, she is asymptomatic, Cardiology     recommended her following up outpatient in about 10 days and to     hold her beta blocker for now.  She was started on Norvasc to help     with blood pressure control. 3. Dyslipidemia:  Started her on Crestor for Neurology recommendation. 4. Diabetes.  Her medications were decreased.  Glucotrol was held     secondary to her having low blood sugars in the hospital that can     be adjusted further by PCP on discharge.  The patient is being     discharged to assisted living with home health PT.  DISCHARGE LABORATORY DATA:  Sodium 140, potassium 3.6, chloride 105, CO2 of 27, glucose 157, BUN 5, and creatinine 0.94.  WBC 6.2, hemoglobin 9.3, MCV 74.6, and platelets 254,00.  CK 50, MB 1.0, and troponin 0.02. Total cholesterol 193, triglycerides 199, HDL 38, LDL 115, and hemoglobin A1c 6.4.  TSH 2.291.  Time spent with the patient and family and doing this discharge is approximately 45 minutes.     [name], M.D.     [name]  D:  10/16/2010  T:  10/16/2010  Job:  [id number]  cc:   Tamara P. Tamara Brownie, MD [name]  Electronically Signed by [name] M.D. on 11/15/2010 01:31:39 PM

## 2010-11-21 ENCOUNTER — Encounter: Payer: Self-pay | Admitting: Cardiovascular Disease

## 2010-11-21 ENCOUNTER — Ambulatory Visit (INDEPENDENT_AMBULATORY_CARE_PROVIDER_SITE_OTHER): Payer: Medicare Other | Admitting: Cardiovascular Disease

## 2010-11-21 DIAGNOSIS — I10 Essential (primary) hypertension: Secondary | ICD-10-CM

## 2010-11-21 DIAGNOSIS — I495 Sick sinus syndrome: Secondary | ICD-10-CM

## 2010-11-21 DIAGNOSIS — I739 Peripheral vascular disease, unspecified: Secondary | ICD-10-CM

## 2010-11-21 DIAGNOSIS — I6789 Other cerebrovascular disease: Secondary | ICD-10-CM

## 2010-11-21 DIAGNOSIS — E785 Hyperlipidemia, unspecified: Secondary | ICD-10-CM

## 2010-11-29 NOTE — Progress Notes (Signed)
Summary: Office Visit  Office Visit   Imported By: Harlon Flor 11/23/2010 15:49:41  _____________________________________________________________________  External Attachment:    Type:   Image     Comment:   External Document

## 2010-11-29 NOTE — Assessment & Plan Note (Signed)
Summary: [facility]/# [phone]([facility])/[facility]   Visit Type:  Initial Consult Primary Provider:  Shon Hale McGranaghan  CC:  s/p hospital visit. pt had stroke in 10/14/2010. denies chest pain and SOB.Marland Kitchen  History of Present Illness: Ms. Jobst is a very pleasant 77 year old woman with a history of diabetes, stroke in 2005 with left-sided deficits, recent TIA and evaluation at [facility] October 15 2010, also diagnosed with bradycardia at that time with heart rate in the 30s to 40s, paroxysmal atrial tachycardia who presents to establish care.  She reports that she has been doing better and starting to work with physical therapy. She does not have any residual deficits from her TIA but does have left-sided weakness. She did break several toes of her left foot and this is still in a walking cast. Overall she has no complaints apart from weakness.  Carotid ultrasound shows 60-79% disease on the left internal carotid artery  Echocardiogram shows moderate LVH, normal function, diastolic dysfunction, mild to moderately elevated right ventricular systolic pressures estimated at 50 mmHg.  EKG normal sinus rhythm with rate 80 beats per minute, no significant ST or T wave changes  Lab work from January 2012 shows total cholesterol 193, LDL 115, HDL 38  Current Medications (verified): 1)  Aggrenox 25-200 Mg  Cp12 (Aspirin-Dipyridamole) .... Take One By Mouth Two Times A Day 2)  Ultram 50 Mg  Tabs (Tramadol Hcl) .... Take 1 Tablet By Mouth Three Times A Day 3)  Flonase 50 Mcg/act  Susp (Fluticasone Propionate) .... Two Sprays in Each Nostril Once A Day. 4)  Citalopram Hydrobromide 10 Mg Tabs (Citalopram Hydrobromide) .Marland Kitchen.. 1 Daily 5)  Prilosec 20 Mg Cpdr (Omeprazole) .Marland Kitchen.. 1 Before Breakfast 6)  Lisinopril 40 Mg Tabs (Lisinopril) .Marland Kitchen.. 1 Tab Daily 7)  Amlodipine Besylate 10 Mg Tabs (Amlodipine Besylate) .Marland Kitchen.. 1 Tablet Once Daily 8)  Loratadine 10 Mg Tabs (Loratadine) .Marland Kitchen.. 1 Daily As Directed 9)   Meclizine Hcl 12.5 Mg Tabs (Meclizine Hcl) .Marland Kitchen.. 1 Tab By Mouth Three Times Daily  As Needed For Vertigo 10)  Lantus 100 Unit/ml Soln (Insulin Glargine) .Marland Kitchen.. 10 Units Subcutaneously Nightly 11)  Vitamin D (Ergocalciferol) 50000 Unit Caps (Ergocalciferol) .Marland Kitchen.. 1 Tab Monthly 12)  Certavite/antioxidants  Tabs (Multiple Vitamins-Minerals) .Marland Kitchen.. 1 Tablet Once Daily 13)  Crestor 10 Mg Tabs (Rosuvastatin Calcium) .Marland Kitchen.. 1 Tablet At Bedtime 14)  Metformin Hcl 500 Mg Tabs (Metformin Hcl) .Marland Kitchen.. 1 Tablet Three Times A Day  Allergies (verified): No Known Drug Allergies  Past History:  Past Medical History: Last updated: 07/06/2009 Anemia-NOS Anxiety Diabetes mellitus, type II GERD Cerebrovascular accident, hx of Coronary artery disease Allergic rhinitis Osteoarthritis  Past Surgical History: Last updated: 19-Dec-2007 Cataract extraction, bilateral- 1996 Cholecystectomy- 1968 Tonsillectomy- 1929 Fractured Left patella- 1974 CVA- 4 iswchemic strokes Esophageal dilatation- 1992 EGD with Dilatation- 05/2006 Tube in right ear- November 2008  Family History: Last updated: 12/19/07 Twin sister died of MI at age 30. 1 brother-died of MI age in his 60's   Father- died of MI Mother- died of ovarian cancer 3 sisters- all died, pneumonia complications, another had CVA and died, can't remember the other sister's problem.   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 That was the only job she had ever had outside the home. Widowed- Husband died of prostate cancer  in 2007 (? per pt.) Five children, 4 girls and 1 son.   Risk Factors: Smoking Status: never (12/10/2007)  Review of Systems       The patient complains of difficulty walking.  The patient denies fever, weight loss, weight gain, vision  loss, decreased hearing, hoarseness, chest pain, syncope, dyspnea on exertion, peripheral edema, prolonged cough, abdominal pain, incontinence, muscle weakness, depression, and enlarged lymph nodes.    Vital Signs:  Patient profile:   77 year old female Height:      64 inches Weight:      192 pounds BMI:     33.08 Pulse rate:   80 / minute BP sitting:   146 / 76  (left arm) Cuff size:   large  Vitals Entered By: Lysbeth Galas CMA (November 21, 2010 4:27 PM)  Physical Exam  General:  Well developed, well nourished, in no acute distress.in a wheelchair Head:  normocephalic and atraumatic Neck:  Neck supple, no JVD. No masses, thyromegaly or abnormal cervical nodes. Lungs:  Clear bilaterally to auscultation and percussion. Heart:  Non-displaced PMI, chest non-tender; regular rate and rhythm, S1, S2 without murmurs, rubs or gallops. Carotid upstroke normal, no bruit.  Pedals normal pulses. No edema, no varicosities. Abdomen:  Bowel sounds positive; abdomen soft and non-tender without masses, obese Msk:  Back normal, normal gait. Muscle strength and tone normal. Pulses:  pulses normal in all 4 extremities Extremities:  No clubbing or cyanosis. Neurologic:  Alert and oriented x 3. Skin:  Intact without lesions or rashes. Psych:  Normal affect.   Impression & Recommendations:  Problem # 1:  CEREBROVASCULAR ACCIDENT, HX OF (ICD-V12.50) history of carotid ultrasound showing 60-79% disease on the left. We will recheck this ultrasound in one year. We will focus on aggressive lipid management, start aspirin 81 mg daily in addition to Aggrenox.  Orders:  Problem # 2:  HYPERLIPIDEMIA (ICD-272.4) Goal LDL is less than 70. Most recent total cholesterol is 190 with LDL of 115. We will increase her Crestor to 20 mg daily.  Her updated medication list for this problem includes:    Crestor 20 Mg Tabs (Rosuvastatin calcium) .Marland Kitchen... Take one tablet by mouth daily.  Problem # 3:  HYPERTENSION  (ICD-401.9) Blood pressure is reasonably well controlled on her current medication regimen. We have made no further changes.  The following medications were removed from the medication list:    Metoprolol Tartrate 100 Mg Tabs (Metoprolol tartrate) .Marland Kitchen... 1 tab two times a day Her updated medication list for this problem includes:    Lisinopril 40 Mg Tabs (Lisinopril) .Marland Kitchen... 1 tab daily    Amlodipine Besylate 10 Mg Tabs (Amlodipine besylate) .Marland Kitchen... 1 tablet once daily    Aspirin 81 Mg Tbec (Aspirin) .Marland Kitchen... Take one tablet by mouth daily  Problem # 4:  SICK SINUS SYNDROME (ICD-427.81) Recent Hospital course with bradycardia while on beta blockers as well as atrial tachycardia. Rhythm has improved off metoprolol. We will continue to monitor her for now. She does not seem to have symptoms of near syncope.  The following medications were removed from the medication list:    Metoprolol Tartrate 100 Mg Tabs (Metoprolol tartrate) .Marland Kitchen... 1 tab two times a day Her updated medication list for this problem includes:    Aggrenox 25-200 Mg Cp12 (Aspirin-dipyridamole) .Marland Kitchen... Take one by mouth two times a day    Lisinopril 40 Mg Tabs (Lisinopril) .Marland Kitchen... 1 tab daily    Amlodipine Besylate 10 Mg Tabs (Amlodipine besylate) .Marland KitchenMarland KitchenMarland KitchenMarland Kitchen  1 tablet once daily    Aspirin 81 Mg Tbec (Aspirin) .Marland Kitchen... Take one tablet by mouth daily  Other Orders: EKG w/ Interpretation (93000) Carotid Duplex (Carotid Duplex)  Patient Instructions: 1)  Your physician recommends that you schedule a follow-up appointment in: 6 months 2)  Your physician has recommended you make the following change in your medication: INCREASE Crestor to 20mg  once daily. 3)  TO BE SCHEDULE DIN 1 YEAR:  Your physician has requested that you have a carotid duplex. This test is an ultrasound of the carotid arteries in your neck. It looks at blood flow through these arteries that supply the brain with blood. Allow one hour for this exam. There are no restrictions or  special instructions.

## 2010-12-24 LAB — COMPREHENSIVE METABOLIC PANEL
ALT: 10 U/L (ref 0–35)
CO2: 23 mEq/L (ref 19–32)
Calcium: 8.9 mg/dL (ref 8.4–10.5)
Creatinine, Ser: 1.21 mg/dL — ABNORMAL HIGH (ref 0.4–1.2)
GFR calc non Af Amer: 42 mL/min — ABNORMAL LOW (ref >60)
Glucose, Bld: 137 mg/dL — ABNORMAL HIGH (ref 70–99)

## 2010-12-24 LAB — POCT I-STAT, CHEM 8
BUN: 14 mg/dL (ref 6–23)
Calcium, Ion: 1.07 mmol/L — ABNORMAL LOW (ref 1.12–1.32)
Glucose, Bld: 136 mg/dL — ABNORMAL HIGH (ref 70–99)
TCO2: 23 mmol/L (ref 0–100)

## 2010-12-24 LAB — CK TOTAL AND CKMB (NOT AT ARMC): Relative Index: INVALID (ref 0.0–2.5)

## 2010-12-24 LAB — GLUCOSE, CAPILLARY
Glucose-Capillary: 126 mg/dL — ABNORMAL HIGH (ref 70–99)
Glucose-Capillary: 143 mg/dL — ABNORMAL HIGH (ref 70–99)
Glucose-Capillary: 162 mg/dL — ABNORMAL HIGH (ref 70–99)
Glucose-Capillary: 212 mg/dL — ABNORMAL HIGH (ref 70–99)
Glucose-Capillary: 59 mg/dL — ABNORMAL LOW (ref 70–99)
Glucose-Capillary: 96 mg/dL (ref 70–99)
Glucose-Capillary: 99 mg/dL (ref 70–99)

## 2010-12-24 LAB — DIFFERENTIAL
Eosinophils Absolute: 0.3 10*3/uL (ref 0.0–0.7)
Lymphocytes Relative: 23 % (ref 12–46)
Lymphs Abs: 2.1 10*3/uL (ref 0.7–4.0)
Neutrophils Relative %: 64 % (ref 43–77)

## 2010-12-24 LAB — BASIC METABOLIC PANEL
BUN: 5 mg/dL — ABNORMAL LOW (ref 6–23)
CO2: 27 mEq/L (ref 19–32)
Calcium: 9 mg/dL (ref 8.4–10.5)
Creatinine, Ser: 0.94 mg/dL (ref 0.4–1.2)
Glucose, Bld: 157 mg/dL — ABNORMAL HIGH (ref 70–99)

## 2010-12-24 LAB — HEMOGLOBIN A1C
Hgb A1c MFr Bld: 6.4 % — ABNORMAL HIGH (ref <5.7)
Mean Plasma Glucose: 137 mg/dL — ABNORMAL HIGH (ref <117)

## 2010-12-24 LAB — PROTIME-INR
INR: 1 (ref 0.00–1.49)
INR: 1.01 (ref 0.00–1.49)
Prothrombin Time: 13.4 seconds (ref 11.6–15.2)
Prothrombin Time: 13.5 seconds (ref 11.6–15.2)

## 2010-12-24 LAB — CBC
MCH: 22.9 pg — ABNORMAL LOW (ref 26.0–34.0)
MCH: 23.4 pg — ABNORMAL LOW (ref 26.0–34.0)
MCHC: 30.7 g/dL (ref 30.0–36.0)
MCHC: 31 g/dL (ref 30.0–36.0)
MCV: 74.6 fL — ABNORMAL LOW (ref 78.0–100.0)
Platelets: 254 10*3/uL (ref 150–400)
Platelets: 289 10*3/uL (ref 150–400)

## 2010-12-24 LAB — LIPID PANEL
HDL: 38 mg/dL — ABNORMAL LOW (ref >39)
Total CHOL/HDL Ratio: 5.1 RATIO
VLDL: 40 mg/dL (ref 0–40)

## 2010-12-24 LAB — POCT CARDIAC MARKERS

## 2010-12-24 LAB — CARDIAC PANEL(CRET KIN+CKTOT+MB+TROPI)
Relative Index: INVALID (ref 0.0–2.5)
Total CK: 50 U/L (ref 7–177)

## 2010-12-24 LAB — APTT: aPTT: 29 seconds (ref 24–37)

## 2010-12-24 LAB — TSH: TSH: 2.291 u[IU]/mL (ref 0.350–4.500)

## 2011-02-27 ENCOUNTER — Ambulatory Visit: Payer: Medicare Other

## 2011-04-24 ENCOUNTER — Encounter: Payer: Self-pay | Admitting: Cardiovascular Disease

## 2011-04-24 DIAGNOSIS — M1990 Unspecified osteoarthritis, unspecified site: Secondary | ICD-10-CM | POA: Insufficient documentation

## 2011-05-12 ENCOUNTER — Emergency Department (HOSPITAL_COMMUNITY): Payer: Medicare Other

## 2011-05-12 ENCOUNTER — Inpatient Hospital Stay (HOSPITAL_COMMUNITY)
Admission: EM | Admit: 2011-05-12 | Discharge: 2011-05-15 | DRG: 392 | Disposition: A | Discharge status: Home Health | Payer: Medicare Other | Attending: Internal Medicine | Admitting: Internal Medicine

## 2011-05-12 DIAGNOSIS — R1310 Dysphagia, unspecified: Secondary | ICD-10-CM | POA: Diagnosis present

## 2011-05-12 DIAGNOSIS — K449 Diaphragmatic hernia without obstruction or gangrene: Secondary | ICD-10-CM | POA: Diagnosis present

## 2011-05-12 DIAGNOSIS — I69959 Hemiplegia and hemiparesis following unspecified cerebrovascular disease affecting unspecified side: Secondary | ICD-10-CM

## 2011-05-12 DIAGNOSIS — E669 Obesity, unspecified: Secondary | ICD-10-CM | POA: Diagnosis present

## 2011-05-12 DIAGNOSIS — E785 Hyperlipidemia, unspecified: Secondary | ICD-10-CM | POA: Diagnosis present

## 2011-05-12 DIAGNOSIS — F411 Generalized anxiety disorder: Secondary | ICD-10-CM | POA: Diagnosis present

## 2011-05-12 DIAGNOSIS — Z79899 Other long term (current) drug therapy: Secondary | ICD-10-CM

## 2011-05-12 DIAGNOSIS — K222 Esophageal obstruction: Secondary | ICD-10-CM | POA: Diagnosis present

## 2011-05-12 DIAGNOSIS — I10 Essential (primary) hypertension: Secondary | ICD-10-CM | POA: Diagnosis present

## 2011-05-12 DIAGNOSIS — Z794 Long term (current) use of insulin: Secondary | ICD-10-CM

## 2011-05-12 DIAGNOSIS — I69991 Dysphagia following unspecified cerebrovascular disease: Secondary | ICD-10-CM

## 2011-05-12 DIAGNOSIS — E119 Type 2 diabetes mellitus without complications: Secondary | ICD-10-CM | POA: Diagnosis present

## 2011-05-12 DIAGNOSIS — K219 Gastro-esophageal reflux disease without esophagitis: Secondary | ICD-10-CM | POA: Diagnosis present

## 2011-05-12 DIAGNOSIS — K224 Dyskinesia of esophagus: Secondary | ICD-10-CM | POA: Diagnosis present

## 2011-05-12 DIAGNOSIS — K225 Diverticulum of esophagus, acquired: Principal | ICD-10-CM | POA: Diagnosis present

## 2011-05-12 LAB — POCT PREGNANCY, URINE: Preg Test, Ur: NEGATIVE

## 2011-05-12 LAB — COMPREHENSIVE METABOLIC PANEL
AST: 30 U/L (ref 0–37)
Albumin: 3.7 g/dL (ref 3.5–5.2)
Alkaline Phosphatase: 55 U/L (ref 39–117)
BUN: 14 mg/dL (ref 6–23)
Chloride: 101 mEq/L (ref 96–112)
Potassium: 4.4 mEq/L (ref 3.5–5.1)
Sodium: 136 mEq/L (ref 135–145)
Total Bilirubin: 0.3 mg/dL (ref 0.3–1.2)
Total Protein: 7.3 g/dL (ref 6.0–8.3)

## 2011-05-12 LAB — POCT I-STAT, CHEM 8
BUN: 13 mg/dL (ref 6–23)
Calcium, Ion: 1.15 mmol/L (ref 1.12–1.32)
Chloride: 105 meq/L (ref 96–112)
Creatinine, Ser: 1.1 mg/dL (ref 0.50–1.10)
Glucose, Bld: 105 mg/dL — ABNORMAL HIGH (ref 70–99)
HCT: 37 % (ref 36.0–46.0)
Hemoglobin: 12.6 g/dL (ref 12.0–15.0)
Potassium: 3.9 meq/L (ref 3.5–5.1)
Sodium: 140 meq/L (ref 135–145)
TCO2: 21 mmol/L (ref 0–100)

## 2011-05-12 LAB — CBC
HCT: 35.7 % — ABNORMAL LOW (ref 36.0–46.0)
Hemoglobin: 11.6 g/dL — ABNORMAL LOW (ref 12.0–15.0)
MCH: 26.5 pg (ref 26.0–34.0)
MCHC: 32.5 g/dL (ref 30.0–36.0)
MCV: 81.7 fL (ref 78.0–100.0)
RDW: 16.3 % — ABNORMAL HIGH (ref 11.5–15.5)

## 2011-05-12 LAB — CK TOTAL AND CKMB (NOT AT ARMC)
CK, MB: 1.7 ng/mL (ref 0.3–4.0)
Relative Index: INVALID (ref 0.0–2.5)

## 2011-05-12 LAB — DIFFERENTIAL
Basophils Absolute: 0 10*3/uL (ref 0.0–0.1)
Eosinophils Relative: 3 % (ref 0–5)
Lymphocytes Relative: 24 % (ref 12–46)
Monocytes Absolute: 0.9 10*3/uL (ref 0.1–1.0)
Monocytes Relative: 8 % (ref 3–12)

## 2011-05-12 LAB — URINALYSIS, ROUTINE W REFLEX MICROSCOPIC
Bilirubin Urine: NEGATIVE
Glucose, UA: NEGATIVE mg/dL
Leukocytes, UA: NEGATIVE
Nitrite: NEGATIVE
Specific Gravity, Urine: 1.016 (ref 1.005–1.030)
pH: 6 (ref 5.0–8.0)

## 2011-05-12 LAB — TROPONIN I: Troponin I: <0.3 ng/mL (ref <0.30)

## 2011-05-12 LAB — PROTIME-INR: INR: 1.02 (ref 0.00–1.49)

## 2011-05-13 ENCOUNTER — Other Ambulatory Visit (HOSPITAL_COMMUNITY): Payer: Medicare Other

## 2011-05-13 ENCOUNTER — Inpatient Hospital Stay (HOSPITAL_COMMUNITY): Payer: Medicare Other

## 2011-05-13 ENCOUNTER — Encounter (HOSPITAL_COMMUNITY): Payer: Self-pay | Admitting: Radiology

## 2011-05-13 LAB — GLUCOSE, CAPILLARY
Glucose-Capillary: 141 mg/dL — ABNORMAL HIGH (ref 70–99)
Glucose-Capillary: 172 mg/dL — ABNORMAL HIGH (ref 70–99)

## 2011-05-13 LAB — MRSA PCR SCREENING: MRSA by PCR: NEGATIVE

## 2011-05-13 LAB — LIPID PANEL
Cholesterol: 157 mg/dL (ref 0–200)
HDL: 54 mg/dL (ref >39)
Triglycerides: 251 mg/dL — ABNORMAL HIGH (ref <150)
VLDL: 50 mg/dL — ABNORMAL HIGH (ref 0–40)

## 2011-05-13 MED ORDER — IOHEXOL 300 MG/ML  SOLN
75.0000 mL | Freq: Once | INTRAMUSCULAR | Status: AC | PRN
  Administered 2011-05-13: 75 mL via INTRAVENOUS

## 2011-05-14 DIAGNOSIS — K225 Diverticulum of esophagus, acquired: Secondary | ICD-10-CM

## 2011-05-14 DIAGNOSIS — R1310 Dysphagia, unspecified: Secondary | ICD-10-CM

## 2011-05-14 LAB — GLUCOSE, CAPILLARY: Glucose-Capillary: 115 mg/dL — ABNORMAL HIGH (ref 70–99)

## 2011-05-14 LAB — URINE CULTURE
Colony Count: NO GROWTH
Culture  Setup Time: 201207300022

## 2011-05-15 DIAGNOSIS — K224 Dyskinesia of esophagus: Secondary | ICD-10-CM

## 2011-05-15 DIAGNOSIS — K222 Esophageal obstruction: Secondary | ICD-10-CM

## 2011-05-15 LAB — GLUCOSE, CAPILLARY
Glucose-Capillary: 113 mg/dL — ABNORMAL HIGH (ref 70–99)
Glucose-Capillary: 136 mg/dL — ABNORMAL HIGH (ref 70–99)
Glucose-Capillary: 159 mg/dL — ABNORMAL HIGH (ref 70–99)

## 2011-05-15 LAB — CBC
Hemoglobin: 9.7 g/dL — ABNORMAL LOW (ref 12.0–15.0)
MCH: 26.1 pg (ref 26.0–34.0)
MCHC: 31.8 g/dL (ref 30.0–36.0)
Platelets: 229 10*3/uL (ref 150–400)
RDW: 16.4 % — ABNORMAL HIGH (ref 11.5–15.5)

## 2011-05-15 LAB — COMPREHENSIVE METABOLIC PANEL
ALT: 7 U/L (ref 0–35)
Albumin: 2.9 g/dL — ABNORMAL LOW (ref 3.5–5.2)
Calcium: 8.4 mg/dL (ref 8.4–10.5)
GFR calc Af Amer: >60 mL/min (ref >60)
Glucose, Bld: 131 mg/dL — ABNORMAL HIGH (ref 70–99)
Potassium: 3.6 mEq/L (ref 3.5–5.1)
Sodium: 141 mEq/L (ref 135–145)
Total Protein: 6 g/dL (ref 6.0–8.3)

## 2011-05-28 NOTE — Discharge Summary (Signed)
NAMESUDIKSHA, VICTOR NO.:  [id number]  MEDICAL RECORD NO.:  [id number]  LOCATION:  3007                         FACILITY:  [facility]  PHYSICIAN:  Lonia Blood, M.D.       DATE OF BIRTH:  Feb 26, 1920  DATE OF ADMISSION:  05/12/2011 DATE OF DISCHARGE:  05/15/2011                              DISCHARGE SUMMARY   PRIMARY CARE PHYSICIAN:  Dr. Merlene Pulling with company called [facility].  DISCHARGE DIAGNOSES: 1. Dysphagia due to esophageal dysmotility, distal esophageal     stricture, and Zenker diverticulum - improved after esophageal     stretching. 2. History of stroke in right frontal lobe with left-sided hemiparesis     and intermittent dysphagia. 3. Diabetes mellitus type 2. 4. Hypertension. 5. Gastroesophageal reflux disease. 6. Hiatal hernia. 7. Hyperlipidemia. 8. Status post appendectomy. 9. Status post knee surgery.  DISCHARGE MEDICATIONS: 1. Aggrenox 25/200 one capsule by mouth twice a day. 2. Amlodipine 5 mg daily. 3. Aspirin 81 mg daily. 4. Citalopram 10 mg daily. 5. Crestor 20 mg daily. 6. Iron 325 mg 3 times a day. 7. Flovent 110 mcg 2 puffs inhaled daily. 8. Guaifenesin 10 mL by mouth every 6 hours needed for cough. 9. Lantus 20 units at bedtime. 10.Lisinopril 40 mg daily. 11.Loratadine 10 mg daily as needed for nasal drainage. 12.Meclizine 12.5 mg 3 times a day. 13.Metformin 500 mg 3 times a day. 14.Metoprolol 25 mg 1/2 tablet 8 o'clock in the morning and 8 o'clock     at night. 15.Milk of magnesia 30 mL by mouth daily as needed for constipation. 16.Multivitamin 1 tablet daily. 17.Omeprazole 20 mg daily. 18.Timolol 1 drop both eyes daily. 19.Tramadol 50 mg by mouth every 8 hours. 20.Tylenol 650 mg by mouth every 6 hours as needed for pain. 21.Vitamin D 50,000 units month.  CONDITION ON DISCHARGE:  Ms. Ducey is discharged in good condition.  She will return to her assisted living.  She will have  assisted-living arrange for physical therapy and speech therapy.  PROCEDURE DURING THIS ADMISSION: 1. On May 13, 2011, the patient underwent a CT scan of the neck with     intravenous contrast read as no acute abnormality. 2. On May 13, 2011, MRI of the brain.  Impression:  Remote     encephalomalacia involving the right frontal lobe as well as right     parietooccipital lobe, no acute intracranial abnormality or     significant change, stable atrophy, white matter disease, chronic     bilateral mastoid effusions. 3. On May 13, 2011, modified barium swallow by speech pathologist,     findings of moderate to severe pharyngeal dysphagia with     significant Zenker diverticulum as well as residue throughout the     esophagus consistent with significant esophageal dysmotility. 4. On May 15, 2011, upper endoscopy with balloon dilatation.     Stricture was found in the distal esophagus, balloon dilatation     with a TTS 12 mm to 15 mm, tortuous esophagus, 5 cm hiatal hernia,     otherwise normal examination.  CONSULTATION DURING THIS ADMISSION:  The patient was seen in consultation by [facility].  HISTORY AND  PHYSICAL:  Refer dictated H and P done by Dr. Toniann Fail on April 22, 2011.  HOSPITAL COURSE:  Ms. Winstanley is an 77 year old with multiple medical problems including old stroke and chronic dysphagia who was brought to the emergency room after she was experiencing significant difficulty swallowing.  Initially, the workup was geared towards making sure she did not have another stroke.  The patient was admitted to the neurology floor where she underwent the frequent hospital evaluation and telemetry monitoring and she had an MRI of her brain on May 13, 2011, which did not indicate a new stroke.  A modified barium swallow showed that the etiology of the patient's dysphagia was due to severe esophageal dysmotility, esophageal stricture, and Zenker diverticulum.   Throughout this admission, the patient was actually able to swallow a mechanical soft diet and drink thin liquids without any marked dysphagia being observed clinically.  After the esophageal dilatation procedure, she is able to drink liquids without much of in terms of problems with dysphagia.  We feel that at this point in time the patient can return to her assisted living and she can follow up as an outpatient for her Zenker diverticulum with [facility].  Otherwise, the patient has been continued on her antiplatelet therapy, statin medication, insulin, and antihypertensives without changes.     Lonia Blood, M.D.     SL/MEDQ  D:  05/15/2011  T:  05/15/2011  Job:  [id number]  cc:   Dr. Merlene Pulling  Electronically Signed by Lonia Blood M.D. on 05/28/2011 05:37:30 PM

## 2011-06-18 NOTE — H&P (Signed)
[name], [name] NO.:  [id number]  MEDICAL RECORD NO.:  [id number]  LOCATION:  [facility]                         FACILITY:  [facility]  PHYSICIAN:  Eduard Clos, MDDATE OF BIRTH:  05-02-1920  DATE OF ADMISSION:  05/12/2011 DATE OF DISCHARGE:                             HISTORY & PHYSICAL   PRIMARY CARE PHYSICIAN:  Dr. Shon Hale McGranaghan.  CHIEF COMPLAINT:  Difficulty swallowing.  HISTORY OF PRESENT ILLNESS:  An 77 year old female with previous history of CVA which left her with left-sided weakness, history of hypertension, diabetes mellitus type 2, GERD, and history of esophageal stricture status post dilatation 2 years ago has been having difficulty swallowing.  As per patient's daughter, patient does have difficulty swallowing over the last 2 years, but last 4-5 days, it has worsened to the point that she is not able to eat last 2 days.  She is not able to swallow both solid and liquid, whenever she does, she gets a feeling of something getting stuck and she has to bring it back everything.  The patient did not have any loss of consciousness, did not have any dizziness, or any focal deficits, did not have any difficulty speaking. The patient states that she felt like as if she had a sore throat like feeling whenever she swallows.  Denies any fever or chills.  Denies any chest pain, shortness of breath, nausea, vomiting, abdominal pain, dysuria, discharge, or diarrhea.  PAST MEDICAL HISTORY:  History of CVA which left her with left-sided weakness, hypertension, diabetes mellitus type 2, GERD, multiple TIAs, history of hiatal hernia, history of esophageal stricture status post dilatation 2 years ago probably, her dilatation was done at [facility], history of hyperlipidemia, anxiety, history of appendectomy, knee surgery, and hip fracture surgery.  MEDICATIONS PRIOR TO ADMISSION: 1. The patient is on Lantus insulin 20 units subcutaneous at  bedtime. 2. Vitamin D. 3. Omeprazole 20 mg p.o. daily. 4. Amlodipine 5 mg p.o. daily. 5. Aspirin 81 mg p.o. daily. 6. Strovite. 7. Fluticasone 50 mcg nasal spray. 8. Crestor 20 mg daily. 9. Lisinopril 40 mg by month once daily, 10.Timolol eye drop. 11.Aggrenox 25/200 mg daily. 12.Metoprolol 25 mg half a tablet by mouth twice daily. 13.Tramadol 50 mg daily. 14.Ferrous sulfate 325 mg daily. 15.Meclizine 12.5 mg p.o. three times daily. 16.Metformin 500 mg 1 tablet three times daily. 17.Guaifenesin. 18.Loratadine. 19.Tylenol.  ALLERGIES:  No known drug allergies.  FAMILY HISTORY:  Both parents are deceased.  Mother died at age 74 from ovarian cancer.  Father died from a heart attack.  SOCIAL HISTORY:  The patient lives at nursing home.  Denies smoking cigarettes, drinking alcohol, or using illegal drugs.  She has 5 children.  REVIEW OF SYSTEMS:  As per history of present illness, nothing else significant.  PHYSICAL EXAMINATION:  GENERAL:  The patient examined at bedside not in acute distress. VITAL SIGNS:  Blood pressure is 160/70, pulse 90 per minute, temperature 99.1, respirations is 18 per minute, O2 sat 95%. HEENT:  Anicteric.  No pallor.  No discharge from ears, eyes, nose, or mouth.  I do not see any swelling in her throat at this time, I was not able to feel any  lymphadenopathy in the neck.  There is no neck rigidity. CHEST:  Bilateral air entry present.  No rhonchi, no crepitation. Heart:  S1 and S2 heard. ABDOMEN:  Soft, nontender.  Bowel sounds heard. CNS:  Alert, awake, and oriented to time, place, and person, is able to move both upper and lower extremities.  At this time, the patient does not have any pronator drift or dysdiadochokinesia.  The patient is able to see well in both her eyes though her old stroke did leave with some mild vision problem in the left eye. EXTREMITIES:  Peripheral pulses felt.  No edema.  LABORATORY DATA:  EKG has been ordered.   Chest x-ray shows no acute findings.  CT head without contrast shows no acute intracranial abnormality, old right temporoparietal and frontal lobe infarct with extensive chronic microvascular white matter disease.  CBC, WBC is 10.3, hemoglobin is 12.6, hematocrit is 37, platelets 265.  PT/INR is 13.6 and 1, glucose 96.  Complete metabolic panel, sodium 140, potassium 3.9, chloride 105, carbon dioxide 24, glucose 105, BUN 13, creatinine 1.1, total bilirubin is 0.3, alkaline phosphatase is 55, AST 38, ALT 13, total protein 7.3, albumin 3.7, calcium 9.5.  CK is 61, MB is 1.7, troponin less than 0.3.  Pregnancy screen is negative.  UA is negative for nitrites and leukocytes.  CT head was showing no acute intracranial normality, old right temporoparietal and frontal lobe infarcts, and extensive chronic microvascular white matter disease.  ASSESSMENT: 1. Difficulty swallowing.  Previous history of cerebrovascular     accident which left patient with mild left-sided weakness. 2. History of esophageal stricture with dilatation 2 years ago. 3. Diabetes mellitus type 2. 4. Previous history of transient ischemic attacks. 5. History of hypertension. 6. History of hyperlipidemia. 7. Obesity.  PLAN: 1. At this time, we will admit patient to telemetry. 2. For her difficulty swallowing at this time, the patient has had     difficulty swallowing both solids and liquids, but has no history     of weight loss.  The patient's blood count at this time does not     show any significant anemia.  The patient does say of some sore     throat like feeling for which I am going to get a strep throat.  At     this time, I am going to order a CAT scan of the neck with contrast     since the patient has had multiple episode of TIA and also had a     CVA.  The patient will be given neuro checks with MRI of the brain,     if the CT neck and MRI of the brain is negative, then patient has     to get a GI evaluation  for her dysphagia as patient does have     history of strictures. 3. At this time, the patient will be kept n.p.o. until we get a formal     swallow evaluation and the diet will be decided by the speech     therapist. 4. At this time, the patient will be checked CBG q.4 with sliding     scale insulin and also hydralazine p.r.n. for her systolic blood     pressure more than 160.  Further recommendation as condition     evolves.     Eduard Clos, MD     ANK/MEDQ  D:  05/12/2011  T:  05/12/2011  Job:  [id number]  cc:   Dr.  [facility] McGranaghan  Electronically Signed by Midge Minium MD on 06/18/2011 08:56:43 AM

## 2011-06-30 ENCOUNTER — Emergency Department (HOSPITAL_COMMUNITY): Payer: Medicare Other

## 2011-06-30 ENCOUNTER — Encounter: Payer: Self-pay | Admitting: Internal Medicine

## 2011-06-30 ENCOUNTER — Inpatient Hospital Stay (HOSPITAL_COMMUNITY)
Admission: EM | Admit: 2011-06-30 | Discharge: 2011-07-03 | DRG: 203 | Disposition: A | Discharge status: Nursing Home (Includes ICF) | Payer: Medicare Other | Attending: Internal Medicine | Admitting: Internal Medicine

## 2011-06-30 DIAGNOSIS — E119 Type 2 diabetes mellitus without complications: Secondary | ICD-10-CM | POA: Diagnosis present

## 2011-06-30 DIAGNOSIS — K224 Dyskinesia of esophagus: Secondary | ICD-10-CM | POA: Diagnosis present

## 2011-06-30 DIAGNOSIS — Z79899 Other long term (current) drug therapy: Secondary | ICD-10-CM

## 2011-06-30 DIAGNOSIS — F329 Major depressive disorder, single episode, unspecified: Secondary | ICD-10-CM | POA: Diagnosis present

## 2011-06-30 DIAGNOSIS — F3289 Other specified depressive episodes: Secondary | ICD-10-CM | POA: Diagnosis present

## 2011-06-30 DIAGNOSIS — R1310 Dysphagia, unspecified: Secondary | ICD-10-CM | POA: Diagnosis present

## 2011-06-30 DIAGNOSIS — Z66 Do not resuscitate: Secondary | ICD-10-CM | POA: Diagnosis present

## 2011-06-30 DIAGNOSIS — Z794 Long term (current) use of insulin: Secondary | ICD-10-CM

## 2011-06-30 DIAGNOSIS — K225 Diverticulum of esophagus, acquired: Secondary | ICD-10-CM | POA: Diagnosis present

## 2011-06-30 DIAGNOSIS — Z7982 Long term (current) use of aspirin: Secondary | ICD-10-CM

## 2011-06-30 DIAGNOSIS — R933 Abnormal findings on diagnostic imaging of other parts of digestive tract: Secondary | ICD-10-CM

## 2011-06-30 DIAGNOSIS — J209 Acute bronchitis, unspecified: Principal | ICD-10-CM | POA: Diagnosis present

## 2011-06-30 DIAGNOSIS — K219 Gastro-esophageal reflux disease without esophagitis: Secondary | ICD-10-CM | POA: Diagnosis present

## 2011-06-30 DIAGNOSIS — D649 Anemia, unspecified: Secondary | ICD-10-CM | POA: Diagnosis present

## 2011-06-30 DIAGNOSIS — I10 Essential (primary) hypertension: Secondary | ICD-10-CM | POA: Diagnosis present

## 2011-06-30 DIAGNOSIS — E785 Hyperlipidemia, unspecified: Secondary | ICD-10-CM | POA: Diagnosis present

## 2011-06-30 DIAGNOSIS — R1319 Other dysphagia: Secondary | ICD-10-CM

## 2011-06-30 DIAGNOSIS — Z8673 Personal history of transient ischemic attack (TIA), and cerebral infarction without residual deficits: Secondary | ICD-10-CM

## 2011-06-30 LAB — CK TOTAL AND CKMB (NOT AT ARMC)
CK, MB: 2.2 ng/mL (ref 0.3–4.0)
Relative Index: INVALID (ref 0.0–2.5)
Total CK: 37 U/L (ref 7–177)

## 2011-06-30 LAB — DIFFERENTIAL
Basophils Absolute: 0 10*3/uL (ref 0.0–0.1)
Basophils Relative: 0 % (ref 0–1)
Eosinophils Absolute: 0.2 10*3/uL (ref 0.0–0.7)
Eosinophils Relative: 1 % (ref 0–5)
Lymphocytes Relative: 15 % (ref 12–46)
Lymphs Abs: 1.8 K/uL (ref 0.7–4.0)
Monocytes Absolute: 1.1 K/uL — ABNORMAL HIGH (ref 0.1–1.0)
Monocytes Relative: 9 % (ref 3–12)
Neutro Abs: 9.2 K/uL — ABNORMAL HIGH (ref 1.7–7.7)
Neutrophils Relative %: 75 % (ref 43–77)

## 2011-06-30 LAB — POCT I-STAT TROPONIN I: Troponin i, poc: 0.03 ng/mL (ref 0.00–0.08)

## 2011-06-30 LAB — CBC
HCT: 31.5 % — ABNORMAL LOW (ref 36.0–46.0)
Hemoglobin: 10.2 g/dL — ABNORMAL LOW (ref 12.0–15.0)
MCH: 27 pg (ref 26.0–34.0)
MCHC: 32.4 g/dL (ref 30.0–36.0)
MCV: 83.3 fL (ref 78.0–100.0)
Platelets: 276 10*3/uL (ref 150–400)
RBC: 3.78 MIL/uL — ABNORMAL LOW (ref 3.87–5.11)
RDW: 14.9 % (ref 11.5–15.5)
WBC: 12.2 10*3/uL — ABNORMAL HIGH (ref 4.0–10.5)

## 2011-06-30 LAB — BASIC METABOLIC PANEL WITH GFR
BUN: 17 mg/dL (ref 6–23)
CO2: 23 meq/L (ref 19–32)
Chloride: 103 meq/L (ref 96–112)
Creatinine, Ser: 1.02 mg/dL (ref 0.50–1.10)
Glucose, Bld: 177 mg/dL — ABNORMAL HIGH (ref 70–99)
Potassium: 4.1 meq/L (ref 3.5–5.1)

## 2011-06-30 LAB — BASIC METABOLIC PANEL
Calcium: 9.1 mg/dL (ref 8.4–10.5)
GFR calc Af Amer: >60 mL/min (ref >60)
GFR calc non Af Amer: 51 mL/min — ABNORMAL LOW (ref >60)
Sodium: 138 mEq/L (ref 135–145)

## 2011-06-30 LAB — TROPONIN I: Troponin I: <0.3 ng/mL (ref <0.30)

## 2011-06-30 NOTE — H&P (Signed)
Admission Date: 06/30/2011 Attending Pysician: Dr. Doneen Poisson  First Contact: Dr Loistine Chance pager [phone]  Weekends, [place], or after 5pm Weekdays:  1st Contact: [phone] 2nd Contact: [phone]  Chief Complaint: cough and dysphagia  HPI: [name] is an 77 y.o. female. With a PMH of dysphasia due to esophageal dysmotility, esophageal stricture and Zenker diverticulum, multiples strokes, DM, HTN, and GERD, who presents with worsening cough x 2 weeks and dysphagia this morning.  Two weeks ago the patient was in her normal state of health when the cough began with accompanying sinus drainage and sore throat but no fever, chills, or other symptoms.  The patient visited the ENT on Friday, for followup of her previous hospitilization, and he discontinued her Claritin and started her on Mucinex to help her clear mucus as the patient chronically expectorates white mucus that has accumulated in the Zenker diverticulum.  But this morning the patient, accompanied by her two daughters, states the cough has worsened and as of today the sputum is more copious, thick and yellow, with no blood.  The patient does endorse having some shortness of breath and a low grade fever for the first time this morning.  She denies chest pain, chills, nausea, vomiting, abdominal pain, changes in urination or bowel movements.    Also the pt complains of regurgitating food and water this morning, though she is physically able to swallow, and a sore throat.  She was able to eat and drink as usual last night.  She has experienced this before due to esophageal strictures and dysmotility.  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  She has dealt with strictures and the diverticulum for the past 20 years, requiring dilation every 2 years.  The last being one month ago in August, and prior to that, May 2011.      Past Medical History  Diagnosis Date  . Anemia     NOS  . Anxiety   . Diabetes mellitus  type II   . GERD (gastroesophageal reflux disease)   . CAD (coronary artery disease)   . Allergic rhinitis   . Osteoarthritis   . Cerebrovascular accident     Hx of; 4 ischemic strokes    Past Surgical History  Procedure Date  . Cataract extraction 1996    Bilateral  . Cholecystectomy 1968  . Tonsillectomy 1929  . Patella fracture surgery 1974    Left  . Esophageal dilation 1992  . Esophagogastroduodenoscopy 05/2006    With dilation  . Tube in ear 08/2008    Right    Family History  Problem Relation Age of Onset  . Ovarian cancer Mother   . Heart attack Father     MI  . Heart attack Sister 71    MI  . Heart attack Brother 50    MI  . Diabetes Neg Hx   . Pneumonia Sister   . Stroke Sister   . Heart disease Other   . Cancer Other     Maternal side   Social History:  Lives in assisted living facility.  Has five children.  PCP: [facility].  Allergies: NKDA  No current facility-administered medications on file as of 06/30/2011.   Medications Prior to Admission  Medication Sig Dispense Refill  . amLODipine (NORVASC) 5 MG tablet Take 5 mg by mouth daily.        Marland Kitchen aspirin 325 MG tablet Take 325 mg by mouth daily.        Marland Kitchen  dipyridamole-aspirin (AGGRENOX) 25-200 MG per 12 hr capsule Take 1 capsule by mouth 2 (two) times daily.        . ergocalciferol (VITAMIN D2) 50000 UNITS capsule Take 50,000 Units by mouth every 30 (thirty) days.        . insulin glargine (LANTUS) 100 UNIT/ML injection Inject 20 Units into the skin at bedtime.        Marland Kitchen lisinopril (PRINIVIL,ZESTRIL) 40 MG tablet Take 40 mg by mouth daily.        Marland Kitchen loratadine (CLARITIN) 10 MG tablet Take 10 mg by mouth as directed.        . meclizine (ANTIVERT) 12.5 MG tablet Take 12.5 mg by mouth one time daily as needed.        . metFORMIN (GLUCOPHAGE) 500 MG tablet Take 500 mg by mouth 3 (three) times daily.        . Multiple Vitamins-Minerals (CERTAVITE/ANTIOXIDANTS) TABS Take 1 tablet by mouth daily.         Marland Kitchen omeprazole (PRILOSEC) 20 MG capsule Take 20 mg by mouth AC breakfast.        . rosuvastatin (CRESTOR) 20 MG tablet Take 20 mg by mouth daily.        . traMADol (ULTRAM) 50 MG tablet Take 50 mg by mouth 3 (three) times daily.        --Tessalan Pearls 100mg  1 tab PO TID x 7 days (day 4/7) --Citalopram 10mg  1 tab PO daily --Flovent Inhale 2 puffs PO daily --Metoprolol 25mg   1/2 tab (12.5mg ) PO BID --Ferrous sulfate 325mg  1 tab PO TID --Timolol Maleate 0.5% drops 1 drop to each eye QHS --Tylenol 325mg  1 tab PO Q6h for pain or fever --Milk of Magnesia 400mg /48ml oral susp  30mL PO daily prn constipation --Robitussin 100mg /33mL  10mL PO Q6h prn  --Ceporol Drops prn sore throat  Results for orders placed during the hospital encounter of 06/30/11 (from the past 48 hour(s))  DIFFERENTIAL     Status: Abnormal   Collection Time   06/30/11  4:57 AM      Component Value Range Comment   Neutrophils Relative 75  43 - 77 (%)    Neutro Abs 9.2 (*) 1.7 - 7.7 (K/uL)    Lymphocytes Relative 15  12 - 46 (%)    Lymphs Abs 1.8  0.7 - 4.0 (K/uL)    Monocytes Relative 9  3 - 12 (%)    Monocytes Absolute 1.1 (*) 0.1 - 1.0 (K/uL)    Eosinophils Relative 1  0 - 5 (%)    Eosinophils Absolute 0.2  0.0 - 0.7 (K/uL)    Basophils Relative 0  0 - 1 (%)    Basophils Absolute 0.0  0.0 - 0.1 (K/uL)   CBC     Status: Abnormal   Collection Time   06/30/11  4:57 AM      Component Value Range Comment   WBC 12.2 (*) 4.0 - 10.5 (K/uL)    RBC 3.78 (*) 3.87 - 5.11 (MIL/uL)    Hemoglobin 10.2 (*) 12.0 - 15.0 (g/dL)    HCT 16.1 (*) 09.6 - 46.0 (%)    MCV 83.3  78.0 - 100.0 (fL)    MCH 27.0  26.0 - 34.0 (pg)    MCHC 32.4  30.0 - 36.0 (g/dL)    RDW 04.5  40.9 - 81.1 (%)    Platelets 276  150 - 400 (K/uL)   BASIC METABOLIC PANEL     Status: Abnormal  Collection Time   06/30/11  4:57 AM      Component Value Range Comment   Sodium 138  135 - 145 (mEq/L)    Potassium 4.1  3.5 - 5.1 (mEq/L)    Chloride 103   96 - 112 (mEq/L)    CO2 23  19 - 32 (mEq/L)    Glucose, Bld 177 (*) 70 - 99 (mg/dL)    BUN 17  6 - 23 (mg/dL)    Creatinine, Ser 1.61  0.50 - 1.10 (mg/dL)    Calcium 9.1  8.4 - 10.5 (mg/dL)    GFR calc non Af Amer 51 (*) >60 (mL/min)    GFR calc Af Amer >60  >60 (mL/min)   POCT I-STAT TROPONIN I     Status: Normal   Collection Time   06/30/11  5:24 AM      Component Value Range Comment   Troponin i, poc 0.03  0.00 - 0.08 (ng/mL)    Comment 3            CK TOTAL AND CKMB     Status: Normal   Collection Time   06/30/11  8:35 AM      Component Value Range Comment   Total CK 37  7 - 177 (U/L)    CK, MB 2.2  0.3 - 4.0 (ng/mL)    Relative Index RELATIVE INDEX IS INVALID  0.0 - 2.5    TROPONIN I     Status: Normal   Collection Time   06/30/11  8:35 AM      Component Value Range Comment   Troponin I <0.30  <0.30 (ng/mL)    Dg Chest 2 View  06/30/2011  *RADIOLOGY REPORT*  Clinical Data: Swallowing difficulty.  Cough.  CHEST - 2 VIEW  Comparison: 06/30/2011  Findings: The heart is mildly enlarged.  There is perihilar peribronchial thickening.  There are no focal consolidations or pleural effusions.  No evidence for pulmonary edema.  Mild degenerate changes are seen in the spine.  There is wedge compression fracture of the upper lumbar spine, likely level L2. This is not completely evaluated, at the inferior aspect of the lateral film.  IMPRESSION:  1.  Bronchitic changes. No focal pulmonary abnormality. 2.  Cardiomegaly without pulmonary edema. 3.  Lumbar compression fracture, likely L2. Per CMS PQRS reporting requirements (PQRS Measure 24): Given the patient's age of greater than 50 and the fracture site (hip, distal radius, or spine), the patient should be tested for osteoporosis using DXA, and the appropriate treatment considered based on the DXA results.  Original Report Authenticated By: Patterson Hammersmith, M.D.   Dg Chest Portable 1 View  06/30/2011  *RADIOLOGY REPORT*  Clinical Data: Cough  and shortness of breath; difficulty swallowing.  PORTABLE CHEST - 1 VIEW  Comparison: Chest radiograph performed 05/12/2011  Findings: The lungs are well-aerated.  Mild left basilar airspace opacity raises question for mild pneumonia.  There is no evidence of pleural effusion or pneumothorax.  The cardiomediastinal silhouette is mildly enlarged.  No acute osseous abnormalities are seen.  IMPRESSION:  1.  Mild left basilar opacity raises concern for mild pneumonia. 2.  Mild cardiomegaly.  Original Report Authenticated By: Tonia Ghent, M.D.    Review of Systems  Constitutional: Positive for fever. Negative for chills.  HENT: Positive for sore throat.   Eyes: Negative.   Respiratory: Positive for cough, sputum production and shortness of breath. Negative for stridor.   Cardiovascular: Negative.   Gastrointestinal: Negative.  Genitourinary: Negative.   Musculoskeletal: Negative.   Skin: Negative.   Neurological: Negative.   Endo/Heme/Allergies: Negative.   Psychiatric/Behavioral: Negative.     T: 98.4  BP: 134/70  HR: 79   RR: 16  Physical Exam  Constitutional: She is oriented to person, place, and time. She appears well-developed and well-nourished. No distress.  HENT:  Head: Normocephalic and atraumatic.  Mouth/Throat: Oropharynx is clear and moist. No uvula swelling. No oropharyngeal exudate or posterior oropharyngeal erythema.       Cobblestoning on posterior tongue.  Coughing up thick yellow/green sputum.  Eyes: Conjunctivae and EOM are normal.  Cardiovascular: Normal rate, regular rhythm, normal heart sounds and intact distal pulses.  Exam reveals no gallop and no friction rub.   No murmur heard. Respiratory: Effort normal. No respiratory distress. She has wheezes. She has no rales.  GI: Soft. Bowel sounds are normal. She exhibits no distension. There is no tenderness.  Neurological: She is alert and oriented to person, place, and time.  Skin: Skin is warm and dry.  Psychiatric:  She has a normal mood and affect.     Assessment/Plan Solymar Grace is an 77 y.o. female.  With a PMH of dysphasia due to esophageal dysmotility, esophageal stricture and Zenker diverticulum, multiples strokes, DM, HTN, and GERD, who presents with worsening cough x 2 weeks and dysphagia this morning.    1.  Dysphagia-- Esophageal dysmotility/stricture vs inflamed diverticulum vs pharyngitis. The patient just had a distal esophageal stricture dilated one month ago.  She typically only requires these every 1-2 years.  This is possibly a recurrence in a another location.  The dysphasia could also be caused by an infected/inflamed diverticulum. She has had a recent URI and post nasal drip which could possibly be a source of infection.  Lastly, the patient has been complaining of sore throat, however on exam the oropharynx is completely clear, making pharyngitis less likely.   --Admit patient to floor, make NPO. --Consult speech therapy for swallow evaluation. --Consult GI for structure/diverticulum evaluation.  2. Cough-- PNA vs Acute Bronchitis vs Sinusitis.  The patient has a chronic cough likely caused by chronic PND, diverticulum, and possibly GERD or Lisinopril.  However, the patient's cough today is worse and now productive with thick colored sputum, so acute infectious causes must be considered.  The patient is afebrile, not tachycardic or tachypneic,normal o2 saturation, and wheezing and prolonged expiration instead of rales on exam.  This clinical picture, in addition to a negative CXR, makes PNA less likely.  This is likely acute bronchitis considering productive cough, wheezing on exam, and bronchial changes on CXR.  Post nasal drip from a recent sinusitis is also likely a contributing factor.   --Pt was started on broad spectrum abx initially due to suspicion of PNA, however AP CXR ruled this out, so the abx were discontinued. --Admit pt to regular floor. --Continue albuterol for  wheezing. --Start antihistamine for PND  3. HTN-- Patient BP ranged from 134 to 159 on admission.  --Start metoprolol 2.5mg  IV Q6h  4.  DM--  Pt diabetes well controlled in nursing home on Lantus and Metformin. --Start SSI  5. Hx of strokes--  Restart aggrenox.   6. GERD--  Start home protonix, IV  7. Hyperlipidemia-- Will restart crestor once cleared for PO.  8.  DVT-- Lovenox 40mg  SQ       Almyra Deforest, MD [phone]   Attending Physician:  I have seen and examined the patient. I reviewed the resident  note and agree with the findings and plan of care as documented. My additions and revisions are included.    Signature:___________________________________________   Almyra Deforest 06/30/2011, 11:46 AM

## 2011-07-01 ENCOUNTER — Inpatient Hospital Stay (HOSPITAL_COMMUNITY): Payer: Medicare Other

## 2011-07-01 DIAGNOSIS — R1310 Dysphagia, unspecified: Secondary | ICD-10-CM

## 2011-07-01 DIAGNOSIS — K224 Dyskinesia of esophagus: Secondary | ICD-10-CM

## 2011-07-01 DIAGNOSIS — K225 Diverticulum of esophagus, acquired: Secondary | ICD-10-CM

## 2011-07-01 DIAGNOSIS — R1319 Other dysphagia: Secondary | ICD-10-CM

## 2011-07-01 DIAGNOSIS — R05 Cough: Secondary | ICD-10-CM

## 2011-07-01 LAB — COMPREHENSIVE METABOLIC PANEL
CO2: 23 mEq/L (ref 19–32)
Calcium: 9 mg/dL (ref 8.4–10.5)
Chloride: 102 mEq/L (ref 96–112)
Creatinine, Ser: 0.86 mg/dL (ref 0.50–1.10)
GFR calc Af Amer: >60 mL/min (ref >60)
GFR calc non Af Amer: >60 mL/min (ref >60)
Glucose, Bld: 140 mg/dL — ABNORMAL HIGH (ref 70–99)
Total Bilirubin: 0.4 mg/dL (ref 0.3–1.2)

## 2011-07-01 LAB — GLUCOSE, CAPILLARY
Glucose-Capillary: 112 mg/dL — ABNORMAL HIGH (ref 70–99)
Glucose-Capillary: 121 mg/dL — ABNORMAL HIGH (ref 70–99)
Glucose-Capillary: 121 mg/dL — ABNORMAL HIGH (ref 70–99)
Glucose-Capillary: 140 mg/dL — ABNORMAL HIGH (ref 70–99)

## 2011-07-01 LAB — CBC
MCH: 27.3 pg (ref 26.0–34.0)
Platelets: 247 10*3/uL (ref 150–400)
RBC: 3.84 MIL/uL — ABNORMAL LOW (ref 3.87–5.11)
WBC: 8.7 10*3/uL (ref 4.0–10.5)

## 2011-07-02 ENCOUNTER — Inpatient Hospital Stay (HOSPITAL_COMMUNITY): Payer: Medicare Other

## 2011-07-02 DIAGNOSIS — R059 Cough, unspecified: Secondary | ICD-10-CM

## 2011-07-02 DIAGNOSIS — R05 Cough: Secondary | ICD-10-CM

## 2011-07-02 DIAGNOSIS — R1310 Dysphagia, unspecified: Secondary | ICD-10-CM

## 2011-07-02 LAB — BASIC METABOLIC PANEL
BUN: 9 mg/dL (ref 6–23)
CO2: 23 mEq/L (ref 19–32)
GFR calc non Af Amer: >60 mL/min (ref >60)
Glucose, Bld: 149 mg/dL — ABNORMAL HIGH (ref 70–99)
Potassium: 3.5 mEq/L (ref 3.5–5.1)
Sodium: 140 mEq/L (ref 135–145)

## 2011-07-02 LAB — GLUCOSE, CAPILLARY
Glucose-Capillary: 117 mg/dL — ABNORMAL HIGH (ref 70–99)
Glucose-Capillary: 142 mg/dL — ABNORMAL HIGH (ref 70–99)
Glucose-Capillary: 240 mg/dL — ABNORMAL HIGH (ref 70–99)

## 2011-07-02 LAB — CBC
HCT: 30.1 % — ABNORMAL LOW (ref 36.0–46.0)
Hemoglobin: 9.9 g/dL — ABNORMAL LOW (ref 12.0–15.0)
MCH: 27.4 pg (ref 26.0–34.0)
MCHC: 32.9 g/dL (ref 30.0–36.0)
RBC: 3.61 MIL/uL — ABNORMAL LOW (ref 3.87–5.11)

## 2011-07-03 LAB — GLUCOSE, CAPILLARY
Glucose-Capillary: 221 mg/dL — ABNORMAL HIGH (ref 70–99)
Glucose-Capillary: 327 mg/dL — ABNORMAL HIGH (ref 70–99)

## 2011-07-06 LAB — CULTURE, BLOOD (ROUTINE X 2)
Culture  Setup Time: 201209161755
Culture  Setup Time: 201209161755
Culture: NO GROWTH

## 2011-07-07 NOTE — Discharge Summary (Signed)
[name], [name] NO.:  [id number]  MEDICAL RECORD NO.:  [id number]  LOCATION:  3031                         FACILITY:  [facility]  PHYSICIAN:  Doneen Poisson, MD     DATE OF BIRTH:  [birth year]  DATE OF ADMISSION:  06/30/2011 DATE OF DISCHARGE:  07/03/2011                              DISCHARGE SUMMARY   DISCHARGE DIAGNOSES: 1. Acute bronchitis resolved 2. Dysphagia likely due to Zenker's Diverticulum and esophagus dysmotility      as well as due to history of stroke 3. L2 Lumbar compression fracture, found incidentally. 4. History of stroke in frontal lobe with left-sided intermittent dysphasia. 5. Diabetes. 6. Hypertension. 7. Gastroesophageal reflux disease. 8. Hyperlipidemia.  DISCHARGE MEDICATIONS: 1. Hyoscyamine 0.125 mg tablet under tongue every 4 hours as needed for      esophagal spasm/dysmotility  2. Aggrenox 25/200 one capsule by mouth twice daily. 3. Amlodipine 5 mg 1 tablet by mouth daily. 4. Aspirin 325 one tablet by mouth daily. 5. Cerovite advanced formula 1 tablet by mouth every morning. 6. Citalopram 10 mg 1 tablet by mouth daily. 7. Ferrous sulfate 325 mg 1 tablet by mouth 3 times a day. 8. Flovent 110 mcg 2 puffs inhaled daily. 9. Insulin glargine Lantus 20 units subcutaneously daily at bedtime. 10.Lisinopril 40 mg 1 tablet by mouth daily. 11.Loratadine 10 mg 1 tablet by mouth daily as needed for nasal     drainage. 12.Metformin 500 mg 1 tablet by mouth 3 times a day. 13.Metoprolol tartrate 25 mg one-half tablet by mouth twice daily. 14.Multivitamins 1 tablet by mouth daily. 15.Omeprazole 20 mg 1 capsule by mouth every morning. 16.Robitussin 100 mg/5 mL two tablespoons by mouth every 6 hours as     needed for cough. 17.Tessalon Perles 100 mg 1 capsule by mouth 3 times a day for 7 days     started June 27, 2011. 18.Atenolol Ophthalmic solution 0.5% one drop to the eyes daily at     bedtime. 19.Tramadol 50 mg 1 tablet by mouth  every 8 hours. 20.Vitamin D 50,000 units 1 capsule by mouth monthly.  DISPOSITION AND FOLLOW UP:  [name] was clinically stable and able to tolerate full meals upon discharge. She should eat a soft mechanical  diet with assistence and sitting upright per speech thearpy recommendations.  She will follow up with her primary care physician at her Assisted Living  Facility. She can follow up on a as needed basis with Gastroenerology and  ENT as an outpatient for dysphagia. She was further noted tho have a lumbar  compression fracture.  We recommend that she receive calcitonin nasal spray  1 spray intranasally alternating nostrils everyday.    PROCEDURES PERFORMED:   1. Barium swallow on September 18 showed Zenker's diverticulum is present in the lower cervical esophagus and diverticulum posteriorly just above the cricopharyngeus muscle which appears hypertrophied.  The diverticulum measures approximately 15 mL.  The diverticular wall is smooth without evidence of retained fluid or neoplasm.  The thoracic esophagus is diffusely dilated with very poor motility.  There is a large hiatal hernia present.  2. CXR on September 16 showed    A.  Bronchitic changes. No focal pulmonary  abnormality.   B.  Cardiomegaly without pulmonary edema.   C.  Lumbar compression fracture, likely L2. Per CMS PQRS reporting   requirements (PQRS Measure 24): Given the patient's age of greater   than 50 and the fracture site (hip, distal radius, or spine), the   patient should be tested for osteoporosis using DXA, and the   appropriate treatment considered based on the DXA results.  CONSULTATIONS: 1. Dr. Leone Payor from Gastroenterology was consulted for dysphagia 2. Dr. Enis Slipper , ENT was consulted for dysphagia   BRIEF ADMITTING HISTORY:  [name] is an 77 year old woman with a past medical history of dysphagia due to esophageal dysmotility, esophageal stricture, Zenker diverticulum,  multiple strokes, diabetes mellitus, hypertension, and GERD who presented with worsening cough for 2 weeks and dysphagia in the morning.  She has a Zenker diverticulum; but in the morning of presentation accompanied by her 2 daughters, she stated that the cough worsened becoming more copious, thick, and yellow.  She endorsed having some shortness of breath and low-grade fever for the first time that morning and denied chest pain, chills, nausea, vomiting, abdominal pain, change of urination or bowel movements.  Also, she complained of regurgitating fluid and water that morning though she was physically able to swallow.  She also complained of a sore throat.  She was able to eat and drink as usual the night before.  She had experienced this type of episode before due to esophageal stricture and dysmotility that was treated with esophageal dilation.  She has chronic left pharyngeal weakness due to her prior CVA.  She has dealt with these strictures and diverticulum for the past 20 years requiring dilation every 2 years with the last one being 1 month ago in August and prior to that in May 2011.  ADMITTING PHYSICAL EXAM: VITAL SIGNS:  Temperature 98.4, blood pressure 134/70, heart rate 79,  respiratory rate 16, oxygen sat 94%. GENERAL:  Oriented to person, place, and time.  Well developed, well  nourished, no acute distress. HEENT:  Normocephalic and atraumatic.  Oropharynx clear and moist.  No  uvula swelling.  No oropharyngeal exudate or posterior oropharyngeal  erythema.  Coughing up thick yellow green sputum.  Conjunctivae and  extraocular muscles normal. CARDIOVASCULAR:  Normal rate.  Regular rhythm.  Heart sounds and  distal pulses intact.  No murmurs, gallops or rubs. RESPIRATORY:  Scattered wheezes.  No rales or rhonchi.  Effort normal. No respiratory distress. GI:  Abdomen soft, nontender, nondistended.  Bowel sounds normal.   NEUROLOGIC:  Alert and oriented to person, place,  and time. SKIN:  Warm and dry. PSYCHIATRIC: Normal mood and affect.  ADMITTING LABS:  White blood cell count 12.2, hemoglobin 10.2, hematocrit 31.5, platelets 276.  Sodium 138, potassium 4.1, chloride 103, bicarb 23, BUN 17, creatinine 1.02, glucose 177. Troponin, cardiac enzymes negative.  Hemoglobin A1c 6.6.  HOSPITAL COURSE: 1. Dysphagia  likely multifactorial in the setting of esophagus spasm/     dymotility, Zenker's diverticulum and chronic dysphagia due to      history of stroke.   A modified barium swallow showed mild to      moderate Zenker's diverticulum, very poor motility of the      esophagus with large hiatal hernia.  GI and ENT physicians were      consulted and endoscopic resection of the diverticulum was felt not      to be indicated at this time.  Dilation was also not indicated as  there were no strictures.  These episodes are recurrent and resolve      on their own most of the time.  There was discussion of PEG placement      for permanent nutrition; but because the patient has not experienced      any aspiration events and this issue is not frequent, a PEG placement     was not indicated.  Her dysphagia improved during the hospitalization      with the ability to a tolerate regular diet with no trouble swallowing.       GI recommended a Hyoscyamine 0.125 mg tablet under her tongue every 4      hours as needed for esophagus spasm/dysmotility.  She was discharged      to her Assisted Living Facility and can follow up with her primary care     physician at the facility.  2. Cough improved on day of discharge.   This is likely secondary to acute      bronchitis.  The cough and sputum production improved throughout the      hospitalization, required no antibiotics and she remained afebrile and      her leukocytosis also resolved.  She was treated symptomatically with      pseudoephedrine and chlorpheniramine.  3. Hypertension.  Ms. Tweten was maintained on IV  labetalol     throughout her hospitalization.  Blood pressures were stable.  She     was discharged on her home medication without any changes.  4. Diabetes.  Sugars were well controlled on Lantus and sliding scale     insulin.  She is discharged on her home regimen with no changes.     She was continued on her antiplatelet therapy and statins, also with      no changes.  DISCHARGE VITALS:  Temperature 99.1, pulse 92, respirations 20, blood  pressure 167/84, O2 sat 93% on room air.   ______________________________ Almyra Deforest, MD   ______________________________ Doneen Poisson, MD   JI/MEDQ  D:  07/03/2011  T:  07/03/2011  Job:  [id number]  cc:   Iva Boop, MD,FACG  Electronically Signed by Almyra Deforest MD on 07/04/2011 12:04:47 PM Electronically Signed by Doneen Poisson  on 07/07/2011 02:54:41 PM

## 2012-05-01 ENCOUNTER — Ambulatory Visit: Payer: Self-pay | Admitting: Gastroenterology

## 2012-05-04 LAB — PATHOLOGY REPORT

## 2014-08-11 ENCOUNTER — Ambulatory Visit: Payer: Self-pay | Admitting: Specialist

## 2014-08-11 DIAGNOSIS — E119 Type 2 diabetes mellitus without complications: Secondary | ICD-10-CM

## 2014-08-11 DIAGNOSIS — I10 Essential (primary) hypertension: Secondary | ICD-10-CM

## 2014-08-11 LAB — BASIC METABOLIC PANEL
ANION GAP: 9 (ref 7–16)
BUN: 18 mg/dL (ref 7–18)
CHLORIDE: 105 mmol/L (ref 98–107)
CO2: 27 mmol/L (ref 21–32)
CREATININE: 1.28 mg/dL (ref 0.60–1.30)
Calcium, Total: 8.4 mg/dL — ABNORMAL LOW (ref 8.5–10.1)
EGFR (African American): 50 — ABNORMAL LOW
EGFR (Non-African Amer.): 41 — ABNORMAL LOW
Glucose: 296 mg/dL — ABNORMAL HIGH (ref 65–99)
OSMOLALITY: 294 (ref 275–301)
POTASSIUM: 3.9 mmol/L (ref 3.5–5.1)
SODIUM: 141 mmol/L (ref 136–145)

## 2014-08-19 ENCOUNTER — Ambulatory Visit: Payer: Self-pay | Admitting: Specialist

## 2015-02-04 NOTE — Op Note (Signed)
PATIENT NAME:  [name], [name] MR#:  [id number] DATE OF BIRTH:  07/27/1920  DATE OF PROCEDURE:  08/19/2014    PREOPERATIVE DIAGNOSIS: Squamous cell carcinoma in situ, dorsal right long finger.   POSTOPERATIVE DIAGNOSIS: Squamous cell carcinoma in situ, dorsal right long finger.   PROCEDURE: Full-thickness skin excision of lesion, right long finger with full-thickness skin graft.   SURGEON: Myra Rudehristopher Nikkolas Coomes, MD    ANESTHESIA: Local with sedation.   TOURNIQUET TIME: Approximately 45 minutes on the right long finger.   COMPLICATIONS: None.   DRAINS: None.   DESCRIPTION OF PROCEDURE: After adequate induction of intravenous sedation, the right upper extremity is thoroughly prepped with alcohol and ChloraPrep and draped in the standard sterile fashion.  Digital block is performed with plain 1% Xylocaine to the right long finger and the area of the hypothenar eminence from which the graft will be taken is infiltrated with 1% Xylocaine with epinephrine.  After obtaining adequate anesthesia, Penrose drain is used about the base of the long finger. Full-thickness skin excision of the lesion just distal to the PIP joint is then performed and this is approximately 1 cm x 2 cm in size. Synovial tissue overlying the extensor tendons was left intact. The wound is thoroughly irrigated multiple times.  Attention is then turned to the hypothenar eminence where a similar sized graft is removed. The skin edges are undermined and the hypothenar eminence wound is closed with 4-0 nylon. The full thickness skin graft is then defatted and sutured down firmly with a bolus dressing to the excision site. The tourniquet is released. Soft bulky dressing is applied to the finger and the hand and the patient is returned to the recovery room in satisfactory condition having tolerated the procedure quite well.     ____________________________ Clare Gandyhristopher E. Haileyann Staiger, MD ces:DT D: 08/19/2014 13:04:29 ET T: 08/19/2014  13:24:03 ET JOB#: [id number]  cc: Clare Gandyhristopher E. Alexander Aument, MD, <Dictator> Clare GandyHRISTOPHER E Mertie Haslem MD ELECTRONICALLY SIGNED 08/24/2014 16:12

## 2015-02-06 LAB — SURGICAL PATHOLOGY

## 2015-11-15 DEATH — deceased
